# Patient Record
Sex: Female | Born: 1980 | Race: White | Hispanic: No | Marital: Single | Smoking: Former smoker
Health system: Southern US, Community
[De-identification: ages and names within clinical notes are randomized; demographics above are authoritative.]

## PROBLEM LIST (undated history)

## (undated) DIAGNOSIS — R1319 Other dysphagia: Secondary | ICD-10-CM

## (undated) DIAGNOSIS — R1310 Dysphagia, unspecified: Secondary | ICD-10-CM

## (undated) DIAGNOSIS — F32A Depression, unspecified: Secondary | ICD-10-CM

## (undated) DIAGNOSIS — L409 Psoriasis, unspecified: Secondary | ICD-10-CM

## (undated) DIAGNOSIS — F329 Major depressive disorder, single episode, unspecified: Secondary | ICD-10-CM

## (undated) DIAGNOSIS — F419 Anxiety disorder, unspecified: Secondary | ICD-10-CM

## (undated) HISTORY — PX: NO PAST SURGERIES: SHX2092

---
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
## 2006-07-30 ENCOUNTER — Emergency Department (HOSPITAL_COMMUNITY): Admission: EM | Admit: 2006-07-30 | Discharge: 2006-07-30 | Payer: Self-pay | Admitting: Emergency Medicine

## 2008-09-09 ENCOUNTER — Emergency Department (HOSPITAL_COMMUNITY): Admission: EM | Admit: 2008-09-09 | Discharge: 2008-09-09 | Payer: Self-pay | Admitting: Emergency Medicine

## 2010-02-02 ENCOUNTER — Emergency Department (HOSPITAL_COMMUNITY): Admission: EM | Admit: 2010-02-02 | Discharge: 2010-02-02 | Payer: Self-pay | Admitting: Emergency Medicine

## 2010-02-12 ENCOUNTER — Emergency Department (HOSPITAL_COMMUNITY): Admission: EM | Admit: 2010-02-12 | Discharge: 2010-02-12 | Payer: Self-pay | Admitting: Emergency Medicine

## 2010-02-13 ENCOUNTER — Emergency Department (HOSPITAL_COMMUNITY): Admission: EM | Admit: 2010-02-13 | Discharge: 2010-02-13 | Payer: Self-pay | Admitting: Emergency Medicine

## 2010-10-13 ENCOUNTER — Emergency Department (HOSPITAL_COMMUNITY)
Admission: EM | Admit: 2010-10-13 | Discharge: 2010-10-13 | Payer: Self-pay | Source: Home / Self Care | Admitting: Emergency Medicine

## 2010-12-14 ENCOUNTER — Emergency Department (HOSPITAL_COMMUNITY)
Admission: EM | Admit: 2010-12-14 | Discharge: 2010-12-14 | Disposition: A | Discharge status: Home / Self Care | Payer: Self-pay | Attending: Emergency Medicine | Admitting: Emergency Medicine

## 2010-12-14 DIAGNOSIS — K921 Melena: Secondary | ICD-10-CM | POA: Insufficient documentation

## 2010-12-14 DIAGNOSIS — R197 Diarrhea, unspecified: Secondary | ICD-10-CM | POA: Insufficient documentation

## 2010-12-14 DIAGNOSIS — F3289 Other specified depressive episodes: Secondary | ICD-10-CM | POA: Insufficient documentation

## 2010-12-14 DIAGNOSIS — R112 Nausea with vomiting, unspecified: Secondary | ICD-10-CM | POA: Insufficient documentation

## 2010-12-14 DIAGNOSIS — K5289 Other specified noninfective gastroenteritis and colitis: Secondary | ICD-10-CM | POA: Insufficient documentation

## 2010-12-14 DIAGNOSIS — R109 Unspecified abdominal pain: Secondary | ICD-10-CM | POA: Insufficient documentation

## 2010-12-14 DIAGNOSIS — F329 Major depressive disorder, single episode, unspecified: Secondary | ICD-10-CM | POA: Insufficient documentation

## 2010-12-14 LAB — URINALYSIS, ROUTINE W REFLEX MICROSCOPIC
Hgb urine dipstick: NEGATIVE
Ketones, ur: NEGATIVE mg/dL
Nitrite: NEGATIVE
Protein, ur: NEGATIVE mg/dL
Specific Gravity, Urine: 1.03 (ref 1.005–1.030)
pH: 5.5 (ref 5.0–8.0)

## 2010-12-14 LAB — CBC
MCHC: 35.4 g/dL (ref 30.0–36.0)
MCV: 85.2 fL (ref 78.0–100.0)
Platelets: 391 10*3/uL (ref 150–400)
RBC: 4.65 MIL/uL (ref 3.87–5.11)

## 2010-12-14 LAB — DIFFERENTIAL
Basophils Absolute: 0.1 10*3/uL (ref 0.0–0.1)
Basophils Relative: 0 % (ref 0–1)
Eosinophils Absolute: 0.1 10*3/uL (ref 0.0–0.7)
Eosinophils Relative: 1 % (ref 0–5)
Monocytes Absolute: 0.6 10*3/uL (ref 0.1–1.0)
Neutro Abs: 9.4 10*3/uL — ABNORMAL HIGH (ref 1.7–7.7)

## 2010-12-14 LAB — COMPREHENSIVE METABOLIC PANEL
ALT: 14 U/L (ref 0–35)
Albumin: 4.4 g/dL (ref 3.5–5.2)
Alkaline Phosphatase: 39 U/L (ref 39–117)
Creatinine, Ser: 0.7 mg/dL (ref 0.4–1.2)
GFR calc non Af Amer: >60 mL/min (ref >60)
Glucose, Bld: 97 mg/dL (ref 70–99)
Total Bilirubin: 1.4 mg/dL — ABNORMAL HIGH (ref 0.3–1.2)

## 2010-12-14 LAB — LIPASE, BLOOD: Lipase: 24 U/L (ref 11–59)

## 2010-12-14 LAB — POCT PREGNANCY, URINE: Preg Test, Ur: NEGATIVE

## 2010-12-14 LAB — URINE MICROSCOPIC-ADD ON

## 2010-12-15 LAB — URINE CULTURE: Colony Count: >=100000

## 2011-06-25 LAB — DIFFERENTIAL
Basophils Relative: 1 % (ref 0–1)
Lymphocytes Relative: 14 % (ref 12–46)
Monocytes Relative: 4 % (ref 3–12)

## 2011-06-25 LAB — COMPREHENSIVE METABOLIC PANEL
AST: 13 U/L (ref 0–37)
Albumin: 4 g/dL (ref 3.5–5.2)
Alkaline Phosphatase: 35 U/L — ABNORMAL LOW (ref 39–117)
BUN: 7 mg/dL (ref 6–23)
CO2: 27 mEq/L (ref 19–32)
Sodium: 139 mEq/L (ref 135–145)

## 2011-06-25 LAB — POCT I-STAT, CHEM 8
BUN: 9 mg/dL (ref 6–23)
Calcium, Ion: 1.19 mmol/L (ref 1.12–1.32)
Chloride: 106 mEq/L (ref 96–112)
Creatinine, Ser: 0.7 mg/dL (ref 0.4–1.2)
HCT: 45 % (ref 36.0–46.0)
Hemoglobin: 15.3 g/dL — ABNORMAL HIGH (ref 12.0–15.0)
Sodium: 143 mEq/L (ref 135–145)
TCO2: 26 mmol/L (ref 0–100)

## 2011-06-25 LAB — CBC
Hemoglobin: 14.3 g/dL (ref 12.0–15.0)
MCV: 91.6 fL (ref 78.0–100.0)
Platelets: 372 10*3/uL (ref 150–400)
RBC: 4.69 MIL/uL (ref 3.87–5.11)
RDW: 12.9 % (ref 11.5–15.5)
WBC: 11.1 10*3/uL — ABNORMAL HIGH (ref 4.0–10.5)

## 2011-06-25 LAB — POCT URINALYSIS DIP (DEVICE)
Ketones, ur: NEGATIVE mg/dL
Nitrite: NEGATIVE

## 2011-06-25 LAB — OCCULT BLOOD X 1 CARD TO LAB, STOOL: Fecal Occult Bld: POSITIVE

## 2011-06-25 LAB — LIPASE, BLOOD: Lipase: 21 U/L (ref 11–59)

## 2017-12-30 DIAGNOSIS — F1211 Cannabis abuse, in remission: Secondary | ICD-10-CM | POA: Insufficient documentation

## 2018-09-20 NOTE — L&D Delivery Note (Signed)
Kelly Lopez is a 38 y.o. G3P0020 at [redacted]w[redacted]d admitted for IOL forTurner syndrome with associatedcystic hygroma, fetal hydrops, multiple anomalies. Underwent induction of labor with high dose misoprostol, intracervical foley bulb placement and artifical rupture of membranes. Received IV analgesia then epidural. Called for delivery after foley bulb was noted to come out and fetal feet were noted at introitus around 0600 08/31/2019.   Delivery Note At 0605 a non-viable female was delivered in footling breech presentation.  Very large cystic hygroma immediately noted as well as diffuse ascites. After delivery of fetus, there was some red-brown flid and some bleeding, and a portion of the placenta delivered.  The rest of the placenta attached to the umbilical cord was noted to still be adherent, did not come after a few pushing efforts.  EBL about 200 ml at this point.    Pitocin 10 mg IM x 1 given, will be due for next misoprostol dose in less than one hour. This will be given if placenta is still undelivered.  Will keep patient NPO, she was advised about risk of needing D&E for retained placental fragments, bleeding or any other concerning situation.  Will continue to monitor closely. Appropriate support given to her and her husband; they are appropriately grieving and are holding the infant.     Verita Schneiders, MD, [place] for [employer], [place]

## 2019-03-21 ENCOUNTER — Other Ambulatory Visit: Payer: Self-pay

## 2019-03-21 ENCOUNTER — Encounter: Payer: Self-pay | Admitting: Emergency Medicine

## 2019-03-21 DIAGNOSIS — R509 Fever, unspecified: Secondary | ICD-10-CM | POA: Diagnosis present

## 2019-03-21 DIAGNOSIS — U071 COVID-19: Secondary | ICD-10-CM | POA: Insufficient documentation

## 2019-03-21 DIAGNOSIS — J069 Acute upper respiratory infection, unspecified: Secondary | ICD-10-CM | POA: Insufficient documentation

## 2019-03-21 DIAGNOSIS — R110 Nausea: Secondary | ICD-10-CM | POA: Diagnosis not present

## 2019-03-21 LAB — COMPREHENSIVE METABOLIC PANEL
ALT: 52 U/L — ABNORMAL HIGH (ref 0–44)
AST: 54 U/L — ABNORMAL HIGH (ref 15–41)
Albumin: 4.2 g/dL (ref 3.5–5.0)
Alkaline Phosphatase: 43 U/L (ref 38–126)
Anion gap: 12 (ref 5–15)
BUN: 8 mg/dL (ref 6–20)
CO2: 23 mmol/L (ref 22–32)
Calcium: 8.6 mg/dL — ABNORMAL LOW (ref 8.9–10.3)
Chloride: 105 mmol/L (ref 98–111)
Creatinine, Ser: 0.61 mg/dL (ref 0.44–1.00)
GFR calc Af Amer: >60 mL/min (ref >60)
GFR calc non Af Amer: >60 mL/min (ref >60)
Glucose, Bld: 108 mg/dL — ABNORMAL HIGH (ref 70–99)
Potassium: 3.4 mmol/L — ABNORMAL LOW (ref 3.5–5.1)
Sodium: 140 mmol/L (ref 135–145)
Total Bilirubin: 0.7 mg/dL (ref 0.3–1.2)
Total Protein: 7.5 g/dL (ref 6.5–8.1)

## 2019-03-21 LAB — CBC WITH DIFFERENTIAL/PLATELET
Abs Immature Granulocytes: 0.01 10*3/uL (ref 0.00–0.07)
Basophils Absolute: 0 10*3/uL (ref 0.0–0.1)
Basophils Relative: 0 %
Eosinophils Absolute: 0 10*3/uL (ref 0.0–0.5)
Eosinophils Relative: 0 %
HCT: 41.8 % (ref 36.0–46.0)
Hemoglobin: 14.4 g/dL (ref 12.0–15.0)
Immature Granulocytes: 0 %
Lymphocytes Relative: 21 %
Lymphs Abs: 0.9 10*3/uL (ref 0.7–4.0)
MCH: 30.2 pg (ref 26.0–34.0)
MCHC: 34.4 g/dL (ref 30.0–36.0)
MCV: 87.6 fL (ref 80.0–100.0)
Monocytes Absolute: 0.4 10*3/uL (ref 0.1–1.0)
Monocytes Relative: 9 %
Neutro Abs: 3 10*3/uL (ref 1.7–7.7)
Neutrophils Relative %: 70 %
Platelets: 198 10*3/uL (ref 150–400)
RBC: 4.77 MIL/uL (ref 3.87–5.11)
RDW: 12.2 % (ref 11.5–15.5)
WBC: 4.4 10*3/uL (ref 4.0–10.5)
nRBC: 0 % (ref 0.0–0.2)

## 2019-03-21 LAB — URINALYSIS, COMPLETE (UACMP) WITH MICROSCOPIC
Bilirubin Urine: NEGATIVE
Glucose, UA: NEGATIVE mg/dL
Hgb urine dipstick: NEGATIVE
Ketones, ur: 20 mg/dL — AB
Leukocytes,Ua: NEGATIVE
Nitrite: NEGATIVE
Protein, ur: 30 mg/dL — AB
Specific Gravity, Urine: 1.028 (ref 1.005–1.030)
pH: 5 (ref 5.0–8.0)

## 2019-03-21 LAB — LACTIC ACID, PLASMA: Lactic Acid, Venous: 0.9 mmol/L (ref 0.5–1.9)

## 2019-03-21 LAB — POCT PREGNANCY, URINE: Preg Test, Ur: NEGATIVE

## 2019-03-21 MED ORDER — SODIUM CHLORIDE 0.9% FLUSH: 3.0000 mL | Freq: Once | INTRAVENOUS | Status: DC

## 2019-03-21 MED ORDER — ACETAMINOPHEN 325 MG PO TABS
650.0000 mg | ORAL_TABLET | Freq: Once | ORAL | Status: AC | PRN
  Administered 2019-03-21: 650 mg via ORAL
  Filled 2019-03-21: qty 2

## 2019-03-21 NOTE — ED Triage Notes (Signed)
Patient coming in with cough, fever, and weakness. Patient was exposed to some with COVID-19 on 6/23 and was tested on 6/25 and tested negative at [facility]. But 4-5 days ago started having cough, fever and felt very sleepy.

## 2019-03-22 ENCOUNTER — Emergency Department
Admission: EM | Admit: 2019-03-22 | Discharge: 2019-03-22 | Disposition: A | Discharge status: Home / Self Care | Payer: No Typology Code available for payment source | Attending: Emergency Medicine | Admitting: Emergency Medicine

## 2019-03-22 ENCOUNTER — Encounter: Payer: Self-pay | Admitting: Emergency Medicine

## 2019-03-22 ENCOUNTER — Emergency Department: Payer: No Typology Code available for payment source

## 2019-03-22 DIAGNOSIS — R110 Nausea: Secondary | ICD-10-CM

## 2019-03-22 DIAGNOSIS — R509 Fever, unspecified: Secondary | ICD-10-CM

## 2019-03-22 DIAGNOSIS — Z20822 Contact with and (suspected) exposure to covid-19: Secondary | ICD-10-CM

## 2019-03-22 DIAGNOSIS — J069 Acute upper respiratory infection, unspecified: Secondary | ICD-10-CM

## 2019-03-22 DIAGNOSIS — Z20828 Contact with and (suspected) exposure to other viral communicable diseases: Secondary | ICD-10-CM

## 2019-03-22 HISTORY — DX: Depression, unspecified: F32.A

## 2019-03-22 HISTORY — DX: Psoriasis, unspecified: L40.9

## 2019-03-22 HISTORY — DX: Other dysphagia: R13.19

## 2019-03-22 HISTORY — DX: Anxiety disorder, unspecified: F41.9

## 2019-03-22 MED ORDER — ONDANSETRON 4 MG PO TBDP
4.0000 mg | ORAL_TABLET | Freq: Once | ORAL | Status: AC
  Administered 2019-03-22: 4 mg via ORAL
  Filled 2019-03-22: qty 1

## 2019-03-22 MED ORDER — ONDANSETRON 4 MG PO TBDP: ORAL_TABLET | ORAL | 0 refills | Status: DC

## 2019-03-22 MED ORDER — FOSFOMYCIN TROMETHAMINE 3 G PO PACK
3.0000 g | PACK | Freq: Once | ORAL | Status: AC
  Administered 2019-03-22: 3 g via ORAL
  Filled 2019-03-22: qty 3

## 2019-03-22 NOTE — ED Notes (Signed)
EDP in with patient 

## 2019-03-22 NOTE — ED Provider Notes (Signed)
[facility] Emergency Department Provider Note  ____________________________________________   First MD Initiated Contact with Patient 03/22/19 0105     (approximate)  I have reviewed the triage vital signs and the nursing notes.   HISTORY  Chief Complaint Fever and Cough    HPI Kelly Lopez is a 38 y.o. 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  She presents for evaluation of right a viral symptoms that include general malaise, nausea, cough, fever as high as 101.8, sore throat.  She reports that about 9 days ago she was caring for patient and her hospital and spent most of the day with him and then found out after the fact that he was COVID positive.  She was tested about a week ago with employee health at her hospital and the results were negative, but over the last 3 days she has developed all the symptoms described above and she also reports that when they swabbed her at employee health they barely got inside the naris rather than doing a deep sinus swab.  She reports that her symptoms are severe nothing in particular makes it better or worse.  She has not had chest pain or vomiting.  She has had no dysuria but increased urinary frequency and wonders if she may also have a urinary tract infection, but the symptoms are mild.  She is not really having any trouble breathing although she feels bad all over and has a mild cough.        Past Medical History:  Diagnosis Date   Anxiety    Depression    Esophageal dysphagia    Psoriasis     There are no active problems to display for this patient.   History reviewed. No pertinent surgical history.  Prior to Admission medications   Medication Sig Start Date End Date Taking? Authorizing Provider  ondansetron (ZOFRAN ODT) 4 MG disintegrating tablet Allow 1-2 tablets to dissolve in your mouth every 8 hours as needed for nausea/vomiting 03/22/19    Loleta RoseForbach, Laela Deviney, MD    Allergies Patient has no known allergies.  History reviewed. No pertinent family history.  Social History Social History   Tobacco Use   Smoking status: Never Smoker   Smokeless tobacco: Never Used  Substance Use Topics   Alcohol use: Not on file   Drug use: Not on file    Review of Systems Constitutional: Fever and chills with general malaise and generalized weakness and fatigue. Eyes: No visual changes. ENT: +sore throat. Cardiovascular: Denies chest pain. Respiratory: Denies shortness of breath but has a cough. Gastrointestinal: No abdominal pain.  Nausea, no vomiting.  No diarrhea.  No constipation. Genitourinary: No dysuria but some increased urinary frequency concerning for UTI. Musculoskeletal: Negative for neck pain.  Negative for back pain. Integumentary: Negative for rash other than chronic psoriasis Neurological: Negative for headaches, focal weakness or numbness.   ____________________________________________   PHYSICAL EXAM:  VITAL SIGNS: ED Triage Vitals  Enc Vitals Group     BP 03/21/19 2011 108/79     Pulse Rate 03/21/19 2011 (!) 124     Resp 03/21/19 2011 20     Temp 03/21/19 2011 (!) 101.1 F (38.4 C)     Temp Source 03/21/19 2011 Oral     SpO2 03/21/19 2011 95 %     Weight 03/21/19 2012 86.2 kg (190 lb)     Height 03/21/19 2012 1.6 m (5\' 3" )  Head Circumference --      Peak Flow --      Pain Score 03/21/19 2011 6     Pain Loc --      Pain Edu? --      Excl. in GC? --     Constitutional: Alert and oriented.  Ill-appearing but nontoxic.  No acute distress. Eyes: Conjunctivae are normal.  Head: Atraumatic. Nose: No congestion/rhinnorhea. Neck: No stridor.  No meningeal signs.   Cardiovascular: Initially tachycardic at triage but in the exam room she has a normal rate, regular rhythm. Good peripheral circulation. Grossly normal heart sounds. Respiratory: Normal respiratory effort.  No retractions. No audible  wheezing. Gastrointestinal: Soft and nontender. No distention.  Musculoskeletal: No lower extremity tenderness nor edema. No gross deformities of extremities. Neurologic:  Normal speech and language. No gross focal neurologic deficits are appreciated.  Skin:  Skin is warm, dry and intact. No rash noted other than chronic psoriasis most notable at the hairline. Psychiatric: Mood and affect are normal. Speech and behavior are normal.  ____________________________________________   LABS (all labs ordered are listed, but only abnormal results are displayed)  Labs Reviewed  COMPREHENSIVE METABOLIC PANEL - Abnormal; Notable for the following components:      Result Value   Potassium 3.4 (*)    Glucose, Bld 108 (*)    Calcium 8.6 (*)    AST 54 (*)    ALT 52 (*)    All other components within normal limits  URINALYSIS, COMPLETE (UACMP) WITH MICROSCOPIC - Abnormal; Notable for the following components:   Color, Urine AMBER (*)    APPearance CLOUDY (*)    Ketones, ur 20 (*)    Protein, ur 30 (*)    Bacteria, UA RARE (*)    All other components within normal limits  URINE CULTURE  NOVEL CORONAVIRUS, NAA (HOSPITAL ORDER, SEND-OUT TO REF LAB)  LACTIC ACID, PLASMA  CBC WITH DIFFERENTIAL/PLATELET  LACTIC ACID, PLASMA  POC URINE PREG, ED  POCT PREGNANCY, URINE   ____________________________________________  EKG  No indication for EKG ____________________________________________  RADIOLOGY I, Loleta Roseory Margarethe Virgen, personally viewed and evaluated these images (plain radiographs) as part of my medical decision making, as well as reviewing the written report by the radiologist.  ED MD interpretation: No acute abnormalities on chest x-ray  Official radiology report(s): Dg Chest Portable 1 View  Result Date: 03/22/2019 CLINICAL DATA:  Cough and fever EXAM: PORTABLE CHEST 1 VIEW COMPARISON:  None. FINDINGS: The heart size and mediastinal contours are within normal limits. Both lungs are clear. The  visualized skeletal structures are unremarkable. IMPRESSION: No active disease. Electronically Signed   By: Jasmine PangKim  Fujinaga M.D.   On: 03/22/2019 01:17    ____________________________________________   PROCEDURES   Procedure(s) performed (including Critical Care):  Procedures   ____________________________________________   INITIAL IMPRESSION / MDM / ASSESSMENT AND PLAN / ED COURSE  As part of my medical decision making, I reviewed the following data within the electronic MEDICAL RECORD NUMBER Nursing notes reviewed and incorporated, Labs reviewed , Old chart reviewed, Radiograph reviewed  and Notes from prior ED visits        Differential diagnosis includes, but is not limited to, COVID-19, other nonspecific viral respiratory infection, pneumonia, urinary tract infection.  The patient does not feel well and is clearly suffering from some sort of viral infection but does not meet admission criteria.  Her lab work is generally reassuring, no leukocytosis, no lymphopenia, normal lactic acid, initially febrile and tachycardic but  that tachycardia resolved.  She may have a mild urinary tract infection and I am treating with a one-time dose of fosfomycin as well as sending a urine culture.  After I discussed plans with her she does not want to stay in the hospital and I explained that she does not meet criteria regardless, and she needs to treat conservatively at home by treating the symptoms, drinking plenty of fluids, and isolating herself and certainly not returning to work at the hospital.  I am sending the send out COVID-19 test and anticipated will likely be positive.  She has access to MyChart and can get the results online.  I gave the standard COVID-19 information and including strict return precautions and discharge instructions and she understands and agrees with the plan.      ____________________________________________  FINAL CLINICAL IMPRESSION(S) / ED DIAGNOSES  Final diagnoses:   Fever, unspecified fever cause  Viral URI with cough  Close Exposure to Covid-19 Virus  Nausea     MEDICATIONS GIVEN DURING THIS VISIT:  Medications  sodium chloride flush (NS) 0.9 % injection 3 mL (has no administration in time range)  fosfomycin (MONUROL) packet 3 g (has no administration in time range)  ondansetron (ZOFRAN-ODT) disintegrating tablet 4 mg (has no administration in time range)  acetaminophen (TYLENOL) tablet 650 mg (650 mg Oral Given 03/21/19 2022)     ED Discharge Orders         Ordered    ondansetron (ZOFRAN ODT) 4 MG disintegrating tablet     03/22/19 0131          *Please note:  Kelly Lopez was evaluated in Emergency Department on 03/22/2019 for the symptoms described in the history of present illness. She was evaluated in the context of the global COVID-19 pandemic, which necessitated consideration that the patient might be at risk for infection with the SARS-CoV-2 virus that causes COVID-19. Institutional protocols and algorithms that pertain to the evaluation of patients at risk for COVID-19 are in a state of rapid change based on information released by regulatory bodies including the CDC and federal and state organizations. These policies and algorithms were followed during the patient's care in the ED.  Some ED evaluations and interventions may be delayed as a result of limited staffing during the pandemic.*  Note:  This document was prepared using Dragon voice recognition software and may include unintentional dictation errors.   Hinda Kehr, MD 03/22/19 [phone]

## 2019-03-22 NOTE — Discharge Instructions (Signed)
As we discussed, we believe your symptoms are caused by a respiratory virus.  However, because we cannot rule out the possibility of COVID-19 at this time, we recommend that you self-quarantine at home for 14 days, or until 3 consecutive days without fever (without taking medication to make your temperature come down, such as Tylenol (acetaminophen), after your respiratory symptoms have improved, and after at least 7 days have passed since your symptoms first appeared.  Additionally, we sent a nasal swab to the lab, so you should get the results in MyChart within a few business days.  Even if this result is negative, though, we recommend you do not return to work until your symptoms have resolved.  Please coordinate with employee health.  You should have as minimal contact as possible with anyone else including close family as per the [facility] paperwork guidelines listed below. Follow-up with your doctor by phone or online as needed and return immediately to the emergency department or call 911 only if you develop new or worsening symptoms that concern you.  You can find up-to-date information about COVID-19 in [place] by calling the [place]: [phone]. You may also call 2-1-1, or [phone], or additional resources.  You can also find information online at [email or web address]-white.com/, or on the Center for Disease Control (CDC) website at BeginnerSteps.be.

## 2019-03-22 NOTE — ED Notes (Signed)
Patient states she has been running a fever for the past 4-5 days and has been coughing and having SOB. 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

## 2019-03-23 LAB — NOVEL CORONAVIRUS, NAA (HOSP ORDER, SEND-OUT TO REF LAB; TAT 18-24 HRS): SARS-CoV-2, NAA: DETECTED — AB

## 2019-03-23 LAB — URINE CULTURE: Special Requests: NORMAL

## 2019-06-19 DIAGNOSIS — O283 Abnormal ultrasonic finding on antenatal screening of mother: Secondary | ICD-10-CM | POA: Insufficient documentation

## 2019-06-19 DIAGNOSIS — O418X10 Other specified disorders of amniotic fluid and membranes, first trimester, not applicable or unspecified: Secondary | ICD-10-CM | POA: Insufficient documentation

## 2019-06-19 DIAGNOSIS — O09291 Supervision of pregnancy with other poor reproductive or obstetric history, first trimester: Secondary | ICD-10-CM | POA: Insufficient documentation

## 2019-06-19 DIAGNOSIS — O0990 Supervision of high risk pregnancy, unspecified, unspecified trimester: Secondary | ICD-10-CM | POA: Insufficient documentation

## 2019-06-19 DIAGNOSIS — O09522 Supervision of elderly multigravida, second trimester: Secondary | ICD-10-CM | POA: Insufficient documentation

## 2019-06-19 DIAGNOSIS — O09529 Supervision of elderly multigravida, unspecified trimester: Secondary | ICD-10-CM | POA: Insufficient documentation

## 2019-06-19 DIAGNOSIS — O468X1 Other antepartum hemorrhage, first trimester: Secondary | ICD-10-CM | POA: Insufficient documentation

## 2019-07-11 ENCOUNTER — Encounter: Payer: Self-pay | Admitting: Radiology

## 2019-07-11 ENCOUNTER — Encounter: Payer: Self-pay | Admitting: Family Medicine

## 2019-07-11 ENCOUNTER — Other Ambulatory Visit: Payer: Self-pay

## 2019-07-11 ENCOUNTER — Ambulatory Visit (INDEPENDENT_AMBULATORY_CARE_PROVIDER_SITE_OTHER): Payer: No Typology Code available for payment source | Admitting: Family Medicine

## 2019-07-11 VITALS — BP 113/78 | HR 94 | Wt 192.0 lb

## 2019-07-11 DIAGNOSIS — F329 Major depressive disorder, single episode, unspecified: Secondary | ICD-10-CM

## 2019-07-11 DIAGNOSIS — F32A Depression, unspecified: Secondary | ICD-10-CM | POA: Insufficient documentation

## 2019-07-11 DIAGNOSIS — O0991 Supervision of high risk pregnancy, unspecified, first trimester: Secondary | ICD-10-CM

## 2019-07-11 DIAGNOSIS — O99211 Obesity complicating pregnancy, first trimester: Secondary | ICD-10-CM | POA: Diagnosis not present

## 2019-07-11 DIAGNOSIS — O99210 Obesity complicating pregnancy, unspecified trimester: Secondary | ICD-10-CM

## 2019-07-11 DIAGNOSIS — O99341 Other mental disorders complicating pregnancy, first trimester: Secondary | ICD-10-CM

## 2019-07-11 DIAGNOSIS — Z3A13 13 weeks gestation of pregnancy: Secondary | ICD-10-CM

## 2019-07-11 DIAGNOSIS — F419 Anxiety disorder, unspecified: Secondary | ICD-10-CM

## 2019-07-11 DIAGNOSIS — E669 Obesity, unspecified: Secondary | ICD-10-CM | POA: Insufficient documentation

## 2019-07-11 DIAGNOSIS — O358XX0 Maternal care for other (suspected) fetal abnormality and damage, not applicable or unspecified: Secondary | ICD-10-CM | POA: Insufficient documentation

## 2019-07-11 DIAGNOSIS — Z3401 Encounter for supervision of normal first pregnancy, first trimester: Secondary | ICD-10-CM | POA: Insufficient documentation

## 2019-07-11 DIAGNOSIS — F1211 Cannabis abuse, in remission: Secondary | ICD-10-CM

## 2019-07-11 DIAGNOSIS — O0990 Supervision of high risk pregnancy, unspecified, unspecified trimester: Secondary | ICD-10-CM

## 2019-07-11 NOTE — Progress Notes (Signed)
Subjective:   Kelly Lopez is a 38 y.o. G3P0020 at [redacted]w[redacted]d by LMP, early ultrasound being seen today for her first obstetrical visit.  Her obstetrical history is significant for advanced maternal age and obesity and has Antepartum multigravida of advanced maternal age; History of miscarriage, currently pregnant, first trimester; Subchorionic hemorrhage of placenta in first trimester; Supervision of high risk pregnancy, antepartum; History of substance abuse ([place]); Cystic hygroma of fetus in singleton pregnancy; Obesity affecting pregnancy, antepartum; and Anxiety and depression on their problem list.. Pregnancy history fully reviewed.  Carigan reports fatigue, nausea, vomiting and constipation (n/v are mild).   Fatigue is close to her baseline but she reports that on her days off from work she is only awake/out of bed for 6 hours out of the day. She states that she has been this way for as long as she can remember and she does not report an increase since becoming pregnant. She has a history of anxiety and depression, currently on Effexor. She has previously seen a counselor for self-medicating her depression with marijuana that affected her work. Her counseling ended this year and her marijuana use is remote (~2 years ago). She states that she is anxious about this pregnancy because she experienced early pregnancy loss in early 2020 and this pregnancy is complicated by recent abnormal u/s showing cystic hygroma. She desires to continue this pregnancy. She is interested in being connect to behavioral health services during pregnancy.   HISTORY: OB History  Gravida Para Term Preterm AB Living  3 0 0 0 2 0  SAB TAB Ectopic Multiple Live Births  1 1 0 0 0    # Outcome Date GA Lbr Len/2nd Weight Sex Delivery Anes PTL Lv  3 Current           2 SAB 2020          1 TAB 2000           Past Medical History:  Diagnosis Date  . Anxiety   . Depression   . Esophageal dysphagia   . Psoriasis     Past Surgical History:  Procedure Laterality Date  . NO PAST SURGERIES     History reviewed. No pertinent family history. Social History   Tobacco Use  . Smoking status: Never Smoker  . Smokeless tobacco: Never Used  Substance Use Topics  . Alcohol use: Not Currently  . Drug use: Not Currently   Allergies  Allergen Reactions  . Zoloft [Sertraline Hcl]    Current Outpatient Medications on File Prior to Visit  Medication Sig Dispense Refill  . docusate sodium (COLACE) 100 MG capsule Take 100 mg by mouth 2 (two) times daily.    Marland Kitchen omeprazole (PRILOSEC) 20 MG capsule Take 20 mg by mouth daily.    . Prenatal Vit-Fe Fumarate-FA (MULTIVITAMIN-PRENATAL) 27-0.8 MG TABS tablet Take 1 tablet by mouth daily at 12 noon.    . venlafaxine XR (EFFEXOR-XR) 150 MG 24 hr capsule Take 150 mg by mouth daily with breakfast.    . ondansetron (ZOFRAN ODT) 4 MG disintegrating tablet Allow 1-2 tablets to dissolve in your mouth every 8 hours as needed for nausea/vomiting 30 tablet 0   No current facility-administered medications on file prior to visit.     Possible Indications for ASA therapy (per uptodate) One of the following: Previous pregnancy with preeclampsia, especially early onset and with an adverse outcome No Multifetal gestation No Chronic hypertension No Type 1 or 2 diabetes mellitus No  Chronic kidney disease No Autoimmune disease (antiphospholipid syndrome, systemic lupus erythematosus) No (Hx psoriasis)  Two or more of the following: Nulliparity No Obesity (body mass index >30 kg/m2) Yes Family history of preeclampsia in mother or sister No Age ?35 years Yes Sociodemographic characteristics (African American race, low socioeconomic level) No Personal risk factors (eg, previous pregnancy with low birth weight or small for gestational age infant, previous adverse pregnancy outcome [eg, stillbirth], interval >10 years between pregnancies) No  No Indications for early 1 hour GTT (per  uptodate)  BMI >25 (>23 in Asian women) AND one of the following  Gestational diabetes mellitus in a previous pregnancy No Glycated hemoglobin ?5.7 percent (39 mmol/mol), impaired glucose tolerance, or impaired fasting glucose on previous testing No First-degree relative with diabetes No High-risk race/ethnicity (eg, African American, Latino, Native American, [place] American, Pacific Islander) No History of cardiovascular disease No Hypertension or on therapy for hypertension No High-density lipoprotein cholesterol level <35 mg/dL (1.610.90 mmol/L) and/or a triglyceride level >250 mg/dL (0.962.82 mmol/L) No Polycystic ovary syndrome No Physical inactivity Yes Other clinical condition associated with insulin resistance (eg, severe obesity, acanthosis nigricans) No Previous birth of an infant weighing ?4000 g No Previous stillbirth of unknown cause No Exam   Vitals:   07/11/19 1518  BP: 113/78  Pulse: 94  Weight: 192 lb (87.1 kg)   Fetal Heart Rate (bpm): 155  VS reviewed, nursing note reviewed,  Constitutional: well developed, well nourished, no distress HEENT: normocephalic CV: normal rate Pulm/chest wall: normal effort Abdomen: soft Neuro: alert and oriented x 3 Skin: warm, dry Psych: affect normal, no signs of hyper or hypoactivity    Assessment:   Pregnancy: [id number] Patient Active Problem List   Diagnosis Date Noted  . 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 Obesity affecting pregnancy, antepartum 07/11/2019  . Anxiety and depression 07/11/2019  . Antepartum multigravida of advanced maternal age 25/29/2020  . History of miscarriage, currently pregnant, first trimester 06/19/2019  . Subchorionic hemorrhage of placenta in first trimester 06/19/2019  . Supervision of high risk pregnancy, antepartum 06/19/2019  . History of substance abuse ([facility]) 12/30/2017  *Of note, substance use is remote (~2 years ago) and was marijuana, no other substances. Has been to  a counselor and report appropriate interventions for avoiding future use.    Plan:  1. Supervision of high risk pregnancy, antepartum Lelon MastSamantha is progressing appropriately with reassuring FHTs and is being connected to our practice due to her high risk pregnancy features, including her recent u/s showing fetal cystic hygroma. She is anxious about next steps but glad to get connected to our practice.  - Prenatal Vit-Fe Fumarate-FA (MULTIVITAMIN-PRENATAL) 27-0.8 MG TABS tablet; Take 1 tablet by mouth daily at 12 noon. - omeprazole (PRILOSEC) 20 MG capsule; Take 20 mg by mouth daily. - venlafaxine XR (EFFEXOR-XR) 150 MG 24 hr capsule; Take 150 mg by mouth daily with breakfast. - docusate sodium (COLACE) 100 MG capsule; Take 100 mg by mouth 2 (two) times daily. - Enroll Patient in Babyscripts - Babyscripts Schedule Optimization - AMB referral to maternal fetal medicine - US MFM OB DETAIL +14 WK; Future  2. Cystic hygroma of fetus in singleton pregnancy Lelon MastSamantha is concerned about these features but desires to continue her pregnancy to term and prefers direct communication regarding the risks/outcomes of her fetus.  - AMB referral to maternal fetal medicine - US MFM OB DETAIL +14 WK; Future  3. Obesity affecting pregnancy, antepartum Monitor for s/s of gestation  diabetes and HTN, no concerns at this time. Encourage healthy eating and low-intensity exercise at future visits.   4. Anxiety and depression Tabatha reports that she feels her anx/dep is controlled, but her hypersomnolence is concerning for poorly controlled depression. We will connect her with our behavioral health teams and closely follow her for ante and post partum worsening of anxiety/depression. - Ambulatory referral to Integrated Behavioral Health   Initial labs drawn. Continue prenatal vitamins. Genetic screening done with prior [facility] providers, no concerning features. Discussed future amniocentesis.  Ultrasound discussed;  fetal anatomic survey: requested. Problem list reviewed and updated. The nature of [facility] with multiple MDs and other Advanced Practice Providers was explained to patient; also emphasized that residents, students are part of our team. Routine obstetric precautions reviewed. Return in about 4 weeks (around 08/08/2019) for Routine prenatal care, in person.   Irving Shows, Medical Student 07/11/19 4:29 PM

## 2019-07-11 NOTE — Progress Notes (Signed)
Transfer of Care PRENATAL VISIT NOTE  Subjective:  Kelly Lopez is a 38 y.o. G3P0020 at [redacted]w[redacted]d being seen today for ongoing prenatal care- established at [facility] but needs MFM care due to cystic hydroma..  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  Patient reports no complaints.  Contractions: Not present. Vag. Bleeding: None.   . Denies leaking of fluid.   The following portions of the patient's history were reviewed and updated as appropriate: allergies, current medications, past family history, past medical history, past social history, past surgical history and problem list.   Objective:   Vitals:   07/11/19 1518  BP: 113/78  Pulse: 94  Weight: 192 lb (87.1 kg)    Fetal Status: Fetal Heart Rate (bpm): 155         General:  Alert, oriented and cooperative. Patient is in no acute distress.  Skin: Skin is warm and dry. No rash noted.   Cardiovascular: Normal heart rate noted  Respiratory: Normal respiratory effort, no problems with respiration noted  Abdomen: Soft, gravid, appropriate for gestational age.  Pain/Pressure: Absent     Pelvic: Cervical exam deferred        Extremities: Normal range of motion.  Edema: None  Mental Status: Normal mood and affect. Normal behavior. Normal judgment and thought content.   Assessment and Plan:  Pregnancy: G3P0020 at [redacted]w[redacted]d 1. Supervision of high risk pregnancy, antepartum UP dated OB box with labs from [facility] Addressed nature of [facility]-with multiple MDs and other Advanced Practice Providers was  explained to patient; also emphasized that residents, students are part of our team Reviewed model of care at [facility] and collaboration with MFM expected. Reviewed a referral to MFM being placed to discuss role in care as well as [place]. Briefly discussed amniocentesis and role in gold standard for chromosomal abnormalities. Of note Geana has a low risk NIP Counseled on constipation- takes colace. Reviewed other way to reduce constipation.  - Enroll Patient in Babyscripts - Babyscripts Schedule Optimization - AMB referral to maternal fetal medicine  2. 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 Appreciate MFM consultation for this patient.  - AMB referral to maternal fetal medicine - MFM [place] ordered for ~18wk  3. Obesity affecting pregnancy, antepartum TWG=0 lb (0 kg). Encouraged health diet and low impact exercise. Recommend 11-15 lab total weight gain in pregnancy.   4. Anxiety and depression Pattye reports that she feels her anx/dep is controlled, but her hypersomnolence is concerning for poorly controlled depression. We will connect her with our behavioral health specialty and closely follow her for ante and post partum worsening of anxiety/depression. Patient expressed concern about postpartum depression and we discussed risk factors and importance of having integrated care now.  Naquita was also open about her previous struggles with depression and self medication with marijuana. She never used other substance and has been sober from marijuana for >2 years. She has experience with counseling through her work for substance use.  - Continue SNRI as patient is stable and has intolerance for Zoloft which  caused SI. - Ambulatory referral to [place]  .   Preterm labor symptoms and general obstetric precautions including but not limited to vaginal bleeding,  contractions, leaking of fluid and fetal movement were reviewed in detail with the patient. Please refer to After Visit Summary for other counseling recommendations.   Return in about 4 weeks (around 08/08/2019) for Routine prenatal care, in person.  Future Appointments  Date Time Provider [place]  08/07/2019  9:30 AM Emily Filbert, MD [facility]  08/10/2019  1:00 PM [place] 3 [facility]-M[facility]US M[facility]-US   I compiled this note and history with the help of MS3 Gerre Pebbles.  Caren Macadam, MD

## 2019-07-26 ENCOUNTER — Other Ambulatory Visit: Payer: Self-pay

## 2019-07-26 ENCOUNTER — Ambulatory Visit (INDEPENDENT_AMBULATORY_CARE_PROVIDER_SITE_OTHER): Payer: PRIVATE HEALTH INSURANCE | Admitting: Clinical

## 2019-07-26 DIAGNOSIS — F4323 Adjustment disorder with mixed anxiety and depressed mood: Secondary | ICD-10-CM

## 2019-07-26 NOTE — BH Specialist Note (Signed)
Integrated Behavioral Health via Telemedicine Video Visit  07/26/2019 KAMIRAH SHUGRUE [id number]  Number of Integrated Behavioral Health visits: 1 Session Start time: 3:25  Session End time: 4:23 Total time: 82  Referring Provider: Federico Flake, MD Type of Visit: Video Patient/Family location: Home [facility] Provider location: [facility] All persons participating in visit: Patient Kelly Lopez and [facility] Nuha Degner  Confirmed patient's address: Yes  Confirmed patient's phone number: Yes  Any changes to demographics: No   Confirmed patient's insurance: Yes  Any changes to patient's insurance: No   Discussed confidentiality: Yes   I connected with Ariann D Champagne  by a video enabled telemedicine application and verified that I am speaking with the correct person using two identifiers.     I discussed the limitations of evaluation and management by telemedicine and the availability of in person appointments.  I discussed that the purpose of this visit is to provide behavioral health care while limiting exposure to the novel coronavirus.   Discussed there is a possibility of technology failure and discussed alternative modes of communication if that failure occurs.  I discussed that engaging in this video visit, they consent to the provision of behavioral healthcare and the services will be billed under their insurance.  Patient and/or legal guardian expressed understanding and consented to video visit: Yes   PRESENTING CONCERNS: Patient and/or family reports the following symptoms/concerns: Pt states her primary concern today is oversleeping (15-18 hours/day) on days off work, fatigue, anxiety, irritability, and fear of the unknown, attributed to work stress and concern about the baby, and moving into a new home in June. Pt copes by sleeping and sometimes writing or walking; open to learning self-coping strategies today.  Duration of problem: Current pregnancy; Severity of  problem: moderate  STRENGTHS (Protective Factors/Coping Skills): Social support, open to treatment  GOALS ADDRESSED: Patient will: 1.  Reduce symptoms of: anxiety, depression and stress  2.  Increase knowledge and/or ability of: healthy habits and self-management skills  3.  Demonstrate ability to: Increase healthy adjustment to current life circumstances  INTERVENTIONS: Interventions utilized:  Copywriter, advertising, Mining engineer and Psychoeducation and/or Health Education Standardized Assessments completed: GAD-7 and PHQ 9  ASSESSMENT: Patient currently experiencing Adjustment disorder with mixed depression and anxiety.   Patient may benefit from psychoeducation and brief therapeutic interventions regarding coping with symptoms of depression and anxiety .  PLAN: 1. Follow up with behavioral health clinician on : Two weeks 2. Behavioral recommendations:  -Continue taking Effexor, as prescribed -Take a walk on at least one day off work/week (trail on nice days; indoors on rainy days) -CALM relaxation breathing exercise twice daily (prior to work; at bedtime) -Consider writing in journal daily 3. Referral(s): Integrated [employer] (In Clinic)  I discussed the assessment and treatment plan with the patient and/or parent/guardian. They were provided an opportunity to ask questions and all were answered. They agreed with the plan and demonstrated an understanding of the instructions.   They were advised to call back or seek an in-person evaluation if the symptoms worsen or if the condition fails to improve as anticipated.  Maysen Bonsignore C Kalilah Barua  No flowsheet data found.  Depression screen [facility] 2/9 07/26/2019  Decreased Interest 0  Down, Depressed, Hopeless 1  PHQ - 2 Score 1  Altered sleeping 3  Tired, decreased energy 3  Change in appetite 0  Feeling bad or failure about yourself  0  Trouble concentrating 1  Moving slowly or fidgety/restless 0  Suicidal thoughts 0  PHQ-9 Score 8   GAD 7 : Generalized Anxiety Score 07/26/2019  Nervous, Anxious, on Edge 2  Control/stop worrying 1  Worry too much - different things 1  Trouble relaxing 0  Restless 0  Easily annoyed or irritable 2  Afraid - awful might happen 2  Total GAD 7 Score 8

## 2019-07-31 DIAGNOSIS — O219 Vomiting of pregnancy, unspecified: Secondary | ICD-10-CM

## 2019-08-01 MED ORDER — ONDANSETRON 4 MG PO TBDP: 4.0000 mg | ORAL_TABLET | Freq: Three times a day (TID) | ORAL | 1 refills | Status: DC | PRN

## 2019-08-07 ENCOUNTER — Other Ambulatory Visit: Payer: Self-pay

## 2019-08-07 ENCOUNTER — Ambulatory Visit (INDEPENDENT_AMBULATORY_CARE_PROVIDER_SITE_OTHER): Payer: No Typology Code available for payment source | Admitting: Obstetrics & Gynecology

## 2019-08-07 VITALS — BP 115/79 | HR 101 | Wt 199.0 lb

## 2019-08-07 DIAGNOSIS — O358XX0 Maternal care for other (suspected) fetal abnormality and damage, not applicable or unspecified: Secondary | ICD-10-CM

## 2019-08-07 DIAGNOSIS — Z3A17 17 weeks gestation of pregnancy: Secondary | ICD-10-CM | POA: Diagnosis not present

## 2019-08-07 DIAGNOSIS — O99210 Obesity complicating pregnancy, unspecified trimester: Secondary | ICD-10-CM

## 2019-08-07 DIAGNOSIS — O0990 Supervision of high risk pregnancy, unspecified, unspecified trimester: Secondary | ICD-10-CM

## 2019-08-07 DIAGNOSIS — O0992 Supervision of high risk pregnancy, unspecified, second trimester: Secondary | ICD-10-CM

## 2019-08-07 DIAGNOSIS — O99212 Obesity complicating pregnancy, second trimester: Secondary | ICD-10-CM

## 2019-08-07 NOTE — Progress Notes (Signed)
Hands are swelling and going numb

## 2019-08-07 NOTE — BH Specialist Note (Deleted)
Integrated Behavioral Health via Telemedicine Video Visit  08/07/2019 [name] [id number]  Number of Integrated Behavioral Health visits: 2 Session Start time: 10:45***  Session End time: 11:15*** Total time: {IBH Total YJEH:63149702}  Referring Provider: Caren Macadam, MD Type of Visit: Video Patient/Family location: Home [facility] Provider location: [facility] All persons participating in visit: Patient Kelly Lopez and [place]  Confirmed patient's address: Yes  Confirmed patient's phone number: Yes  Any changes to demographics: No   Confirmed patient's insurance: Yes  Any changes to patient's insurance: No   Discussed confidentiality: At previous visit ***  I connected with Kelly Lopez  by a video enabled telemedicine application and verified that I am speaking with the correct person using two identifiers.     I discussed the limitations of evaluation and management by telemedicine and the availability of in person appointments.  I discussed that the purpose of this visit is to provide behavioral health care while limiting exposure to the novel coronavirus.   Discussed there is a possibility of technology failure and discussed alternative modes of communication if that failure occurs.  I discussed that engaging in this video visit, they consent to the provision of behavioral healthcare and the services will be billed under their insurance.  Patient and/or legal guardian expressed understanding and consented to video visit: Yes   PRESENTING CONCERNS: Patient and/or family reports the following symptoms/concerns: *** Duration of problem: Current pregnancy; Severity of problem: moderate***  STRENGTHS (Protective Factors/Coping Skills): Social support; open to treatment  GOALS ADDRESSED: Patient will: 1.  Reduce symptoms of: anxiety, depression and stress *** 2.  Increase knowledge and/or ability of: {IBH Patient Tools:21014057}  3.  Demonstrate  ability to: {IBH Goals:21014053}  INTERVENTIONS: Interventions utilized:  {IBH Interventions:21014054} Standardized Assessments completed: {IBH Screening Tools:21014051}  ASSESSMENT: Patient currently experiencing Adjustment disorder with mixed depression and anxiety ***.   Patient may benefit from continued ***.  PLAN: 1. Follow up with behavioral health clinician on : *** 2. Behavioral recommendations:  -*** -***(Effexor, walk trail/indoors on day off, calm, journal)*** 3. Referral(s): {IBH Referrals:21014055}  I discussed the assessment and treatment plan with the patient and/or parent/guardian. They were provided an opportunity to ask questions and all were answered. They agreed with the plan and demonstrated an understanding of the instructions.   They were advised to call back or seek an in-person evaluation if the symptoms worsen or if the condition fails to improve as anticipated.  Kelly Lopez

## 2019-08-07 NOTE — Progress Notes (Signed)
   PRENATAL VISIT NOTE  Subjective:  Kelly Lopez is a 38 y.o. G3P0020 at [redacted]w[redacted]d being seen today for ongoing prenatal care.  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  Patient reports no complaints.  Contractions: Not present. Vag. Bleeding: None.   . Denies leaking of fluid.   The following portions of the patient's history were reviewed and updated as appropriate: allergies, current medications, past family history, past medical history, past social history, past surgical history and problem list.   Objective:   Vitals:   08/07/19 0939  BP: 115/79  Pulse: (!) 101  Weight: 199 lb (90.3 kg)    Fetal Status: Fetal Heart Rate (bpm): 157         General:  Alert, oriented and cooperative. Patient is in no acute distress.  Skin: Skin is warm and dry. No rash noted.   Cardiovascular: Normal heart rate noted  Respiratory: Normal respiratory effort, no problems with respiration noted  Abdomen: Soft, gravid, appropriate for gestational age.  Pain/Pressure: Absent     Pelvic: Cervical exam deferred        Extremities: Normal range of motion.  Edema: Trace  Mental Status: Normal mood and affect. Normal behavior. Normal judgment and thought content.   Assessment and Plan:  Pregnancy: O1Y0737 at [redacted]w[redacted]d 1. Supervision of high risk pregnancy, antepartum   2. Obesity affecting pregnancy, antepartum - rec'd less than 20 pound weight gain - Hemoglobin A1c  3. 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 Please refer to After Visit Summary for other counseling recommendations.   No follow-ups on file.  Future Appointments  Date Time Provider [place]  08/10/2019 10:45 AM [place]  08/10/2019  1:00 PM [facility] [place] 3 [facility]-MFCUS MFC-US    Emily Filbert, MD

## 2019-08-08 LAB — HEMOGLOBIN A1C
Est. average glucose Bld gHb Est-mCnc: 85 mg/dL
Hgb A1c MFr Bld: 4.6 % — ABNORMAL LOW (ref 4.8–5.6)

## 2019-08-10 ENCOUNTER — Encounter (HOSPITAL_COMMUNITY): Payer: Self-pay

## 2019-08-10 ENCOUNTER — Ambulatory Visit: Payer: Commercial Managed Care - PPO

## 2019-08-10 ENCOUNTER — Other Ambulatory Visit: Payer: Self-pay | Admitting: Family Medicine

## 2019-08-10 ENCOUNTER — Ambulatory Visit (HOSPITAL_COMMUNITY)
Admission: RE | Admit: 2019-08-10 | Discharge: 2019-08-10 | Disposition: A | Discharge status: Home / Self Care | Payer: No Typology Code available for payment source | Source: Ambulatory Visit | Attending: Obstetrics and Gynecology | Admitting: Obstetrics and Gynecology

## 2019-08-10 ENCOUNTER — Ambulatory Visit (HOSPITAL_BASED_OUTPATIENT_CLINIC_OR_DEPARTMENT_OTHER): Payer: No Typology Code available for payment source | Admitting: *Deleted

## 2019-08-10 ENCOUNTER — Ambulatory Visit (HOSPITAL_COMMUNITY): Payer: No Typology Code available for payment source

## 2019-08-10 ENCOUNTER — Other Ambulatory Visit: Payer: Self-pay

## 2019-08-10 DIAGNOSIS — O09522 Supervision of elderly multigravida, second trimester: Secondary | ICD-10-CM | POA: Insufficient documentation

## 2019-08-10 DIAGNOSIS — Z3A18 18 weeks gestation of pregnancy: Secondary | ICD-10-CM | POA: Diagnosis present

## 2019-08-10 DIAGNOSIS — O99210 Obesity complicating pregnancy, unspecified trimester: Secondary | ICD-10-CM | POA: Diagnosis present

## 2019-08-10 DIAGNOSIS — O358XX0 Maternal care for other (suspected) fetal abnormality and damage, not applicable or unspecified: Secondary | ICD-10-CM

## 2019-08-10 DIAGNOSIS — O0990 Supervision of high risk pregnancy, unspecified, unspecified trimester: Secondary | ICD-10-CM

## 2019-08-10 NOTE — Progress Notes (Signed)
This patient was seen for a detailed ultrasound as a cystic hygroma was noted during her first trimester ultrasound performed at [place].  The patient reports that this is her third pregnancy.  She had a prior elective termination of pregnancy and a first trimester miscarriage with her prior 2 pregnancies.  She denies any significant past medical history and denies any problems in her current pregnancy.  She reports that she has not received any counseling regarding the significance of the cystic hygroma.  She had a cell free DNA test which indicated a low risk for trisomy 21, 18, and 13.  A female fetus is predicted.  On today's exam, a large cystic hygroma along with fetal hydrops was noted.  The fetal growth and amniotic fluid level appeared appropriate for her gestational age.  The patient was advised that a cystic hygroma may be seen in fetuses with a chromosomal abnormality or a congenital heart defect.  It can also be seen in fetuses without a chromosomal abnormality.  She was advised that some cystic hygromas that are noted in the first trimester may resolve in the second trimester.  The prognosis for a successful pregnancy if a cystic hygroma is seen with fetal hydrops in the second trimester is usually poor.    As fetal hydrops was noted today along with the large cystic hygroma, the patient was advised that she will most likely have a fetal demise within the next few weeks.  Due to the increased risk of a chromosomal abnormality with a cystic hygroma, the patient was advised that an amniocentesis is recommended for definitive diagnosis of fetal aneuploidy.  After a complete discussion of the risks (including a miscarriage rate of 1 in 300) and benefits of the procedure, the patient consented to have the amniocentesis performed today.  An uncomplicated genetic amniocentesis was performed today obtaining 30 cc of straw-colored amniotic fluid which was sent off for amniotic fluid AFP, FISH,  and chromosome analysis with reflex MicroArray testing.  The patient was advised that our genetic counselor will notify her regarding the results of the amniocentesis.  Post amniocentesis instructions were discussed.  As the patient's blood type is Rh positive, a dose of RhoGam was not given following the procedure.  The option for termination of pregnancy due to the poor prognosis was also discussed today.  The patient was advised that she should let us know sometime next week regarding her decision.  The patient will await the [facility] results before making a final decision.  Should the patient continue with her pregnancy, we will continue to follow her with serial ultrasounds.  A fetal echocardiogram will also be ordered later in her pregnancy.  A total of 30 minutes was spent counseling and coordinating the care for this patient.  Greater than 50% of the time was spent in direct face-to-face contact.

## 2019-08-13 ENCOUNTER — Telehealth (HOSPITAL_COMMUNITY): Payer: Self-pay | Admitting: *Deleted

## 2019-08-13 NOTE — Telephone Encounter (Signed)
Called to follow up with Kelly Lopez following amniocentesis. Pt having no problems post procedure, but emotionally not doing well. Explained to her that we can connect her with someone to talk to if she would like, she voiced understanding. Will wait for results from amniocentesis.

## 2019-08-14 ENCOUNTER — Telehealth (HOSPITAL_COMMUNITY): Payer: Self-pay | Admitting: Genetic Counselor

## 2019-08-14 NOTE — Telephone Encounter (Addendum)
I called Ms. 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 No numerical abnormalities were identified for chromosomes 13,18, or 21, reducing the likelihood of trisomy 74, trisomy18, and trisomy 21 in the fetus. I informed Ms.Rumplethat while FISH results are not considered diagnostic, this result is consistent with her ultrasound findings of a cystic hygroma and hydrops. We discussed that fetal karyotype on the amniocentesis sample is still being completed. While it is expected that karyotype will confirm the results from [facility], there is the possibility of something else being identified on karyotype, such as a chromosomal rearrangement.  We discussed that fetuses with Turner syndrome may present prenatally with increased nuchal translucency, cystic hygroma, hydrops, cardiac defects, and renal anomalies on ultrasound. Additionally, there is up to a 99% risk for miscarriage in pregnancies affected with Turner syndrome. Given the ultrasound finding of hydrops, Ms. Helvie had previously been counseled by Dr. Annamaria Boots that prognosis is likely expected to be poor. The risk of miscarriage is highest in the first trimester, though second trimester fetal demises and stillbirths have been reported. Females who survive the prenatal period and are born with Turner syndrome typically have short stature and early ovarian failure resulting in infertility. Affected individuals may also present with webbed necks, low posterior hairlines, low-set ears, broad chests, cardiac defects, renal abnormalities, thyroid disease, recurrent otitis media, and hearing loss. Intelligence is typically normal, but individuals may have developmental delay, nonverbal learning disabilities, and behavioral problems. Symptoms can vary widely among individuals.   Monosomy X typically results from the loss of one X chromosome in affected females. I reassured the couple  that there is nothing that they did or did not do to cause this. Rather, Turner syndrome oftenoccursdue to arandom error in chromosomal divisionduring the formation of reproductive cells in a process called nondisjunction. Rarely, Turner syndrome may be caused by a partial deletion of an X chromosome, which can be inherited from a parent. Since Turner syndrome is most often not inherited, recurrence is rare; however, cases of recurrence have been reported. The risk for Turner syndrome does not increase with maternal age unlike some other chromosomal aneuploidies. I informed Ms. Doorn that we would discuss recurrence risks in more detail when results from karyotype are returned.  Ms. Riche and her husband informed me that they were not considering terminating the pregnancy and preferred to continue for as long as the pregnancy would allow. They both understand that a loss may occur due to the findings identified on ultrasound and the prognosis associated with many prenatal cases of Turner syndrome. However, given that many individuals who are born with Turner syndrome are able to live happy, healthy, independent lives, the couple expressed that they want to hold on to that hope for as long as possible while also remaining realistic about the pregnancy. Given that the couple has opted to continue the pregnancy, Dr. Annamaria Boots recommended that Ms. Scannell return for a follow-up ultrasound on 12/4.  I offered to send the couple additional information about Turner syndrome following our conversation, which they desired. I sent Ms. Cammack an email with links to several resources, including information from the [employer] ([employer]) and the [facility]. I also sent her links to guides from the Turner Syndrome Foundation and the Turner Syndrome Society of the [place] discussing what to expect after receiving a prenatal diagnosis of Turner syndrome.   The couple was appropriately emotional learning  of this test result. I validatedthat it can be difficult to hear a diagnosis ofTurner syndromein the pregnancy and that it would be very normal for anyone in thecouple'ssituation to feel upset or overwhelmed with a number of emotions. I also validated their decision to continue the pregnancy, as Ms. Frankum disclosed that she had experienced a devastating miscarriage earlier this year and would not consider termination unless the fetus's diagnosis was severely life-limiting. I informed Ms. Pink that we can set up an in-person genetic counseling appointment to discuss these results in more detail at her next ultrasound appointment if that would be helpful. I also encouraged her to reach out to me via phone or email if she has any questions prior to me calling her with results from karyotype analysis. [address] will call Ms.Culbertson to answer any questions she may have and discuss recurrence risks and options for future pregnancies as soon as I receive the karyotype results.  Gershon Crane, MS Genetic Counselor

## 2019-08-15 ENCOUNTER — Other Ambulatory Visit (HOSPITAL_COMMUNITY): Payer: Self-pay | Admitting: *Deleted

## 2019-08-15 DIAGNOSIS — O358XX0 Maternal care for other (suspected) fetal abnormality and damage, not applicable or unspecified: Secondary | ICD-10-CM

## 2019-08-21 ENCOUNTER — Telehealth (HOSPITAL_COMMUNITY): Payer: Self-pay | Admitting: Genetic Counselor

## 2019-08-21 NOTE — Telephone Encounter (Signed)
LVM for Ms. Petrenko indicating that I have her final testing (karyotype) results. Requested a call back to my direct line to discuss these in more detail, as no identifiers were provided in voicemail message.   Buelah Manis, MS Genetic Counselor

## 2019-08-22 ENCOUNTER — Telehealth (HOSPITAL_COMMUNITY): Payer: Self-pay | Admitting: Genetic Counselor

## 2019-08-22 NOTE — Telephone Encounter (Signed)
I called Ms. Ohagan and her husband Aaron Edelman to discuss her positive karyotype results on amniocentesis that confirmed monosomy X, AKA Turner syndrome, in the current fetus. See previous telephone note that describes Turner syndrome in more detail.   We reviewed that chromosome analysis confirmed 45,X in every cell karyotyped in the fetus. This result indicates that Turner syndrome likely occurred due to chance in this fetus, ruling out the possibility of an inherited partial deletion. For this reason, recurrence is likely low, up to 1% due to the possibility of germline mosaicism. I reminded the couple that the risk for Turner syndrome does not increase with maternal age as it does with some other chromosomal aneuploidies.  I inquired about how the couple has been feeling since receiving their FISH results. Ms. Luallen disclosed that she has gotten over being angry and has moved onto feeling numb. She feels that she is "waiting for the inevitable to happen" by anticipating a miscarriage. This diagnosis has brought back memories of the couple's miscarriage they experienced earlier this year, which has been challenging. I validated that this year has been difficult for the couple and that it is completely reasonable to feel many overwhelming emotions after receiving such heavy information. We discussed that grief is not a linear process, and it is normal to continue grieving the last pregnancy while also grieving the loss of a "typical" pregnancy with the current fetus. Ms. Knipp also expressed concern that her COVID diagnosis around the time of conception could have caused the fetus's cystic hygroma, as she read that some viruses can be associated with such an ultrasound finding. I indicated that cystic hygromas are very common features associated with Turner syndrome and that COVID did not contribute to the diagnosis of Turner syndrome for the pregnancy. I assured the couple that there is nothing they did or did  not do to cause this diagnosis.  Ms. Ramaker is scheduled for an ultrasound this Friday. We discussed that this ultrasound may provide some clarity as to what to expect for the rest of the pregnancy. Mr. Boutelle expressed that while they likely will not pursue termination, it is also extremely difficult to continue waiting to see if they will experience a miscarriage. I offered to meet with the couple following their ultrasound on Friday to discuss this diagnosis and their possible pregnancy management options in detail. The couple expressed that that would be helpful and we agreed upon this plan.  Buelah Manis, MS Genetic Counselor

## 2019-08-24 ENCOUNTER — Other Ambulatory Visit: Payer: Self-pay

## 2019-08-24 ENCOUNTER — Encounter (HOSPITAL_COMMUNITY): Payer: Self-pay

## 2019-08-24 ENCOUNTER — Ambulatory Visit (HOSPITAL_COMMUNITY)
Admission: RE | Admit: 2019-08-24 | Discharge: 2019-08-24 | Disposition: A | Discharge status: Home / Self Care | Payer: No Typology Code available for payment source | Source: Ambulatory Visit | Attending: Obstetrics | Admitting: Obstetrics

## 2019-08-24 ENCOUNTER — Ambulatory Visit (HOSPITAL_COMMUNITY): Payer: No Typology Code available for payment source | Admitting: *Deleted

## 2019-08-24 ENCOUNTER — Telehealth: Payer: Self-pay | Admitting: Obstetrics and Gynecology

## 2019-08-24 ENCOUNTER — Telehealth (HOSPITAL_COMMUNITY): Payer: Self-pay | Admitting: Genetic Counselor

## 2019-08-24 DIAGNOSIS — Z3A20 20 weeks gestation of pregnancy: Secondary | ICD-10-CM

## 2019-08-24 DIAGNOSIS — O358XX0 Maternal care for other (suspected) fetal abnormality and damage, not applicable or unspecified: Secondary | ICD-10-CM

## 2019-08-24 DIAGNOSIS — O09522 Supervision of elderly multigravida, second trimester: Secondary | ICD-10-CM | POA: Diagnosis not present

## 2019-08-24 DIAGNOSIS — O359XX0 Maternal care for (suspected) fetal abnormality and damage, unspecified, not applicable or unspecified: Secondary | ICD-10-CM

## 2019-08-24 DIAGNOSIS — Z362 Encounter for other antenatal screening follow-up: Secondary | ICD-10-CM | POA: Diagnosis not present

## 2019-08-24 DIAGNOSIS — O351XX0 Maternal care for (suspected) chromosomal abnormality in fetus, not applicable or unspecified: Secondary | ICD-10-CM | POA: Diagnosis not present

## 2019-08-24 DIAGNOSIS — O99210 Obesity complicating pregnancy, unspecified trimester: Secondary | ICD-10-CM | POA: Diagnosis present

## 2019-08-24 DIAGNOSIS — O0990 Supervision of high risk pregnancy, unspecified, unspecified trimester: Secondary | ICD-10-CM | POA: Diagnosis present

## 2019-08-24 NOTE — Progress Notes (Signed)
This patient was seen for a follow-up exam due to a fetus with hydrops and cystic hygroma.  Her amniocentesis results confirmed that the fetus has Turner syndrome (23 XO).  The patient has received extensive counseling with our genetic counselor regarding Turner syndrome.  She denies any problems since her last exam.  On today's exam, the overall EFW measures at less than the 1st percentile for her gestational age.  There was normal amniotic fluid noted.  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  Fetal hydrops with skin edema, pericardial and pleural effusions, along with abdominal ascites continues to be noted.  The fetal hydrops appears worse than compared to her last exam.  A possible fetal heart defect was also noted today.  The poor prognosis for her pregnancy due to the large cystic hygroma and the worsening of fetal hydrops was discussed with the patient and her husband.  They were advised that I anticipate that a fetal demise will most likely occur later in her pregnancy.  Management options for her pregnancy including termination of pregnancy versus continued expectant management were discussed again today.  After a complete discussion, the patient and her husband stated that due to the poor prognosis, they would like to proceed with a termination of pregnancy via a Cytotec induction.  Our genetic counselor will help with the patient arrange for the termination procedure as soon as possible.  I advised the patient that I support their decision to proceed with termination.  They were reassured that the risk of recurrence of Turner syndrome with a cystic hygroma is quite low.  I reassured them that I anticipate that she will most likely have a normal pregnancy in the future.  A possible succenturiate lobe of the placenta was noted today.  Care should be taken to ensure that all pieces of the placenta are delivered at the time of induction.

## 2019-08-24 NOTE — Telephone Encounter (Signed)
I called Ms. Kelly Lopez to assist in coordinating her induction procedure. After undergoing her ultrasound today and consulting about the fetus's poor/worsening prognosis with Dr. Annamaria Boots, Ms. Kelly Lopez and her husband opted to undergo termination of pregnancy via induction. This option will allow them to say goodbye and make some memories with their daughter. Prior to my phone call with Ms. Kelly Lopez, I called the OB on call (Dr. Rosana Hoes) to determine if a provider would be willing to perform the induction next week. Dr. Rosana Hoes agreed to investigate this and indicated that someone would be in touch with the patient to get her scheduled for her induction.  Ms. Kelly Lopez and I reviewed the induction by termination procedure, including expectations and possible complications. We also reviewed the 72-hour consent law for terminations in [place] and the Woman's Right to Know Act. Ms. Kelly Lopez agreed to sign off on the Abortion Certification consent form after we had reviewed it together. Following our discussion, I emailed the form to her to sign and requested that she email back a signed copy to me. Once I receive this, I will sign off on the form and have it scanned into her chart.  Ms. Kelly Lopez had no further questions about Turner syndrome or the induction procedure. However, she did inquire about therapists that are available to establish care with. She noted that she had met with someone from [facility], but she was hoping to find a therapist she could meet with to address all of her mental health-related issues rather than ones solely related to the pregnancy. I agreed to reach out to Social Work and research therapists in the area to provide Ms. Kelly Lopez with some options. Ms. Kelly Lopez had no further questions.  Buelah Manis, MS Genetic Counselor

## 2019-08-24 NOTE — Telephone Encounter (Signed)
Received call from [facility], genetic counselor with [facility]. Patient is [redacted]w[redacted]d with fetus with Monosomy X, large cystic hygroma, significant fetal hydrops, possible heart anomaly. Seen today for ultrasound and after counseling at [facility], has opted for termination via cytotec induction. Signed 72 hr state papers today with [facility]. Patient is scheduled for induction next Thursday, December 10. She will have an office visit on Tuesday prior to this induction date.   I relayed the above information to the patient after verifying identity x2. She verbalizes understanding of the above and is in agreement with plan. Answered all questions. She will call with any issues/concerns.   Feliz Beam, M.D. Attending Center for [employer] [facility])

## 2019-08-26 ENCOUNTER — Other Ambulatory Visit: Payer: Self-pay | Admitting: Family Medicine

## 2019-08-26 ENCOUNTER — Other Ambulatory Visit: Payer: Self-pay | Admitting: Obstetrics & Gynecology

## 2019-08-27 ENCOUNTER — Telehealth (HOSPITAL_COMMUNITY): Payer: Self-pay | Admitting: *Deleted

## 2019-08-27 NOTE — Telephone Encounter (Signed)
Preadmission screen  

## 2019-08-28 ENCOUNTER — Other Ambulatory Visit: Payer: Self-pay | Admitting: Advanced Practice Midwife

## 2019-08-28 ENCOUNTER — Other Ambulatory Visit (HOSPITAL_COMMUNITY)
Admission: RE | Admit: 2019-08-28 | Discharge: 2019-08-28 | Disposition: A | Discharge status: Home / Self Care | Payer: No Typology Code available for payment source | Source: Ambulatory Visit | Attending: Obstetrics & Gynecology | Admitting: Obstetrics & Gynecology

## 2019-08-28 ENCOUNTER — Encounter: Payer: Self-pay | Admitting: Obstetrics & Gynecology

## 2019-08-28 ENCOUNTER — Telehealth (INDEPENDENT_AMBULATORY_CARE_PROVIDER_SITE_OTHER): Payer: No Typology Code available for payment source | Admitting: Obstetrics and Gynecology

## 2019-08-28 ENCOUNTER — Other Ambulatory Visit: Payer: Self-pay

## 2019-08-28 DIAGNOSIS — O358XX0 Maternal care for other (suspected) fetal abnormality and damage, not applicable or unspecified: Secondary | ICD-10-CM

## 2019-08-28 DIAGNOSIS — Z3A20 20 weeks gestation of pregnancy: Secondary | ICD-10-CM

## 2019-08-28 DIAGNOSIS — O99210 Obesity complicating pregnancy, unspecified trimester: Secondary | ICD-10-CM

## 2019-08-28 DIAGNOSIS — O09522 Supervision of elderly multigravida, second trimester: Secondary | ICD-10-CM

## 2019-08-28 DIAGNOSIS — O99212 Obesity complicating pregnancy, second trimester: Secondary | ICD-10-CM | POA: Diagnosis not present

## 2019-08-28 DIAGNOSIS — O0992 Supervision of high risk pregnancy, unspecified, second trimester: Secondary | ICD-10-CM

## 2019-08-28 DIAGNOSIS — O0990 Supervision of high risk pregnancy, unspecified, unspecified trimester: Secondary | ICD-10-CM

## 2019-08-28 LAB — SARS CORONAVIRUS 2 (TAT 6-24 HRS): SARS Coronavirus 2: NEGATIVE

## 2019-08-28 NOTE — Progress Notes (Signed)
TELEHEALTH OBSTETRICS PRENATAL VIRTUAL VIDEO VISIT ENCOUNTER NOTE  Provider [facility]: [facility]   I connected with Cathalina D Sproles on 08/28/19 at 10:15 AM EST by MyChart Video Encounter at home and verified that I am speaking with the correct person using two identifiers.   I discussed the limitations, risks, security and privacy concerns of performing an evaluation and management service virtually and the availability of in person appointments. I also discussed with the patient that there may be a patient responsible charge related to this service. The patient expressed understanding and agreed to proceed. Subjective:  Rakesha Dalporto Behring is a 38 y.o. G3P0020 at [redacted]w[redacted]d being seen today for ongoing prenatal care.  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  Patient reports no complaints.  Contractions: Not present. Vag. Bleeding: None.   . Denies any leaking of fluid.   The following portions of the patient's history were reviewed and updated as appropriate: allergies, current medications, past family history, past medical history, past social history, past surgical history and problem list.   Objective:  There were no vitals filed for this visit.  Fetal Status:           General:  Alert, oriented and cooperative. Patient is in no acute distress.  Respiratory: Normal respiratory effort, no problems with respiration noted  Mental Status: Normal mood and affect. Normal behavior. Normal judgment and thought content.  Rest  of physical exam deferred due to type of encounter  Imaging: [place] Mfm Amniocentesis  Result Date: 08/10/2019 ----------------------------------------------------------------------  OBSTETRICS REPORT                       (Signed Final 08/10/2019 05:00 pm) ---------------------------------------------------------------------- Patient Info  ID #:       [id number]                          D.O.B.:  Feb 04, 1981 (38 yrs)  Name:       MALLERIE BLOK Heaton               Visit Date: 08/10/2019 01:26 pm ---------------------------------------------------------------------- Performed By  Performed By:     Marcellina Millin          Ref. Address:     [address]                    RDMS                                                             [address]  Attending:        Ma Rings MD         [facility]  [facility] By[facility]      [facility] ---------------------------------------------------------------------- Orders   #  Description                          Code         Ordered By   1  US MFM AMNIOCENTESIS                 [id number]     Lyndel SafeKIMBERLY [place]   2  US MFM OB DETAIL +14 WK              76811.01     [facility]  ----------------------------------------------------------------------   #  Order #                    Accession #                 Episode #   1  [id number]                  [id number]   2  [id number]                  [id number]                  [id number]  ---------------------------------------------------------------------- Indications   Cystic hygroma                                 O35.288XX9   Advanced maternal age multigravida 4735+,        34O09.522   second trimester   2618 weeks gestation of pregnancy                Z3A.18   Encounter for antenatal screening for          Z36.3   malformations (low risk)  ---------------------------------------------------------------------- Fetal Evaluation  Num Of  Fetuses:         1  Fetal Heart Rate(bpm):  150  Cardiac Activity:       Observed  Presentation:           Breech  Placenta:               Posterior Previa, succenturiate lobe  P. Cord Insertion:      Marginal insertion  Amniotic Fluid  AFI FV:      Subjectively low-normal                              Largest Pocket(cm)                              2.98 ---------------------------------------------------------------------- Biometry  BPD:      39.7  mm     G. Age:  18w 0d         47  %    CI:        89.59   %    70 - 86  FL/HC:      14.1   %    15.8 - 18  HC:      131.9  mm     G. Age:  16w 5d          2  %    HC/AC:      0.85        1.07 - 1.29  AC:      155.5  mm     G. Age:  20w 5d         99  %    FL/BPD:     46.9   %  FL:       18.6  mm     G. Age:  15w 4d        < 1  %    FL/AC:      12.0   %    20 - 24  HUM:      19.5  mm     G. Age:  15w 5d        < 5  %  Est. FW:     225  gm      0 lb 8 oz     44  % ---------------------------------------------------------------------- OB History  Gravidity:    3         Term:   0        Prem:   0        SAB:   1  TOP:          1       Ectopic:  0        Living: 0 ---------------------------------------------------------------------- Gestational Age  U/S Today:     17w 5d                                        EDD:   01/13/20  Best:          18w 1d     Det. By:  Marcella Dubs         EDD:   01/10/20                                      (06/19/19) ---------------------------------------------------------------------- Anatomy  Cranium:               Skin edema             Aortic Arch:            Not well visualized  Cavum:                 Not well visualized    Ductal Arch:            Not well visualized  Ventricles:            Not well visualized    Diaphragm:              Not well visualized  Choroid Plexus:        Appears normal         Stomach:                Appears normal, left  sided  Cerebellum:            Not well visualized    Abdomen:                Abnormal, see                                                                        comments  Posterior Fossa:       Not well visualized    Abdominal Wall:         Not well visualized  Nuchal Fold:           Cystic hygroma         Cord Vessels:           Not well visualized  Face:                  Not well visualized    Kidneys:                Not well visualized  Lips:                  Not well visualized    Bladder:                Appears normal  Thoracic:              Appears normal         Spine:                  Appears normal  Heart:                 Not well visualized    Upper Extremities:      Visualized  RVOT:                  Not well visualized    Lower Extremities:      Visualized  LVOT:                  Not well visualized  Other:  Technically difficult due to fetal position. ---------------------------------------------------------------------- Guided Procedures  Type:   Amniocentesis  FH Post Procedure:     Normal             RH Type:          A+  Rh Immune Globulin:    Not required,      Discharge Inst.:  Post-procedure                         Rh positive                          instructions                                                              given  Needle Insertions:     20 gauge x 1       Vol. Withdrawn:   30 ml  of clear                                                              amniotic fluid  Transabdominal:        Yes  Complications:  None  Comment:                    Informed consent was obtained. A "time-out" was performed before the procedure. Patient                              tolerated the procedure well. We gave her post-procedure instructions. ---------------------------------------------------------------------- Cervix Uterus Adnexa  Cervix  Length:            4.2  cm.  Normal appearance by transabdominal scan.  ---------------------------------------------------------------------- Comments  This patient was seen for a detailed ultrasound as a cystic  hygroma was noted during her first trimester ultrasound  performed at [facility].  The patient reports that this is  her third pregnancy.  She had a prior elective termination of  pregnancy and a first trimester miscarriage with her prior 2  pregnancies.  She denies any significant past medical history  and denies any problems in her current pregnancy.  She  reports that she has not received any counseling regarding  the significance of the cystic hygroma.  She had a cell free DNA test which indicated a low risk for  trisomy 21, 18, and 13.  A female fetus is predicted.  On today's exam, a large cystic hygroma along with fetal  hydrops was noted.  The fetal growth and amniotic fluid level  appeared appropriate for her gestational age.  The patient was advised that a cystic hygroma may be seen  in fetuses with a chromosomal abnormality or a congenital  heart defect.  It can also be seen in fetuses without a  chromosomal abnormality.  She was advised that some cystic  hygromas that are noted in the first trimester may resolve in  the second trimester.  The prognosis for a successful  pregnancy if a cystic hygroma is seen with fetal hydrops in  the second trimester is usually poor.  As fetal hydrops was noted today along with the large cystic  hygroma, the patient was advised that she will most likely  have a fetal demise within the next few weeks.  Due to the increased risk of a chromosomal abnormality with  a cystic hygroma, the patient was advised that an  amniocentesis is recommended for definitive diagnosis of  fetal aneuploidy.  After a complete discussion of the risks  (including a miscarriage rate of 1 in 300) and benefits of the  procedure, the patient consented to have the amniocentesis  performed today.  An uncomplicated genetic amniocentesis was performed  today  obtaining 30 cc of straw-colored amniotic fluid which  was sent off for amniotic fluid AFP, FISH, and chromosome  analysis with reflex MicroArray testing.  The patient was  advised that our genetic counselor will notify her regarding  the results of the amniocentesis.  Post amniocentesis  instructions were discussed.  As the patient's blood type is  Rh positive, a dose of RhoGam was not given following the  procedure.  The  option for termination of pregnancy due to the poor  prognosis was also discussed today.  The patient was  advised that she should let us know sometime next week  regarding her decision.  The patient will await the [facility]  results before making a final decision.  Should the patient continue with her pregnancy, we will  continue to follow her with serial ultrasounds.  A fetal  echocardiogram will also be ordered later in her pregnancy.  A total of 30 minutes was spent counseling and coordinating  the care for this patient.  Greater than 50% of the time was  spent in direct face-to-face contact. ----------------------------------------------------------------------                   Johnell Comings, MD Electronically Signed Final Report   08/10/2019 05:00 pm ----------------------------------------------------------------------  [place] Mfm Ob Detail +14 Wk  Result Date: 08/10/2019 ----------------------------------------------------------------------  OBSTETRICS REPORT                       (Signed Final 08/10/2019 05:00 pm) ---------------------------------------------------------------------- Patient Info  ID #:       [id number]                          D.O.B.:  Sep 13, 1981 (38 yrs)  Name:       TYQUASIA PANT Lax               Visit Date: 08/10/2019 01:26 pm ---------------------------------------------------------------------- Performed By  Performed By:     Berlinda Last          Ref. Address:     [address]                                                             [address]   Attending:        Johnell Comings MD         [facility]  Referred By:      [facility] ---------------------------------------------------------------------- Orders   #  Description                          Code         Ordered By   1  [place] MFM AMNIOCENTESIS                 [phone]     Lauretta Chester   2  [place] MFM OB DETAIL +14 WK              76811.01     [facility] [place]  ----------------------------------------------------------------------   #  Order #  Accession #                 Episode #   1  [id number]                  [id number]   2  [id number]                  [id number]                  [id number]  ---------------------------------------------------------------------- Indications   Cystic hygroma                                 O35.30XX9   Advanced maternal age multigravida 59+,        O62.522   second trimester   [redacted] weeks gestation of pregnancy                Z3A.18   Encounter for antenatal screening for          Z36.3   malformations (low risk)  ---------------------------------------------------------------------- Fetal Evaluation  Num Of Fetuses:         1  Fetal Heart Rate(bpm):  150  Cardiac Activity:       Observed  Presentation:           Breech  Placenta:               Posterior Previa, succenturiate lobe  P. Cord Insertion:      Marginal insertion  Amniotic Fluid  AFI FV:      Subjectively low-normal                              Largest Pocket(cm)                              2.98 ---------------------------------------------------------------------- Biometry  BPD:      39.7  mm     G. Age:  18w 0d         47  %    CI:        89.59   %    70 - 86                                                          FL/HC:      14.1   %    15.8 - 18  HC:      131.9  mm     G. Age:  16w 5d          2  %    HC/AC:      0.85        1.07 - 1.29  AC:      155.5  mm     G. Age:  20w 5d          99  %    FL/BPD:     46.9   %  FL:       18.6  mm     G. Age:  15w 4d        <  1  %    FL/AC:      12.0   %    20 - 24  HUM:      19.5  mm     G. Age:  15w 5d        < 5  %  Est. FW:     225  gm      0 lb 8 oz     44  % ---------------------------------------------------------------------- OB History  Gravidity:    3         Term:   0        Prem:   0        SAB:   1  TOP:          1       Ectopic:  0        Living: 0 ---------------------------------------------------------------------- Gestational Age  U/S Today:     17w 5d                                        EDD:   01/13/20  Best:          18w 1d     Det. ByMarcella Dubs         EDD:   01/10/20                                      (06/19/19) ---------------------------------------------------------------------- Anatomy  Cranium:               Skin edema             Aortic Arch:            Not well visualized  Cavum:                 Not well visualized    Ductal Arch:            Not well visualized  Ventricles:            Not well visualized    Diaphragm:              Not well visualized  Choroid Plexus:        Appears normal         Stomach:                Appears normal, left                                                                        sided  Cerebellum:            Not well visualized    Abdomen:                Abnormal, see  comments  Posterior Fossa:       Not well visualized    Abdominal Wall:         Not well visualized  Nuchal Fold:           Cystic hygroma         Cord Vessels:           Not well visualized  Face:                  Not well visualized    Kidneys:                Not well visualized  Lips:                  Not well visualized    Bladder:                Appears normal  Thoracic:              Appears normal         Spine:                  Appears normal  Heart:                 Not well visualized    Upper Extremities:      Visualized  RVOT:                   Not well visualized    Lower Extremities:      Visualized  LVOT:                  Not well visualized  Other:  Technically difficult due to fetal position. ---------------------------------------------------------------------- Guided Procedures  Type:   Amniocentesis  FH Post Procedure:     Normal             RH Type:          A+  Rh Immune Globulin:    Not required,      Discharge Inst.:  Post-procedure                         Rh positive                          instructions                                                              given  Needle Insertions:     20 gauge x 1       Vol. Withdrawn:   30 ml of clear                                                              amniotic fluid  Transabdominal:        Yes  Complications:  None  Comment:                    Informed consent was obtained. A "time-out" was performed  before the procedure. Patient                              tolerated the procedure well. We gave her post-procedure instructions. ---------------------------------------------------------------------- Cervix Uterus Adnexa  Cervix  Length:            4.2  cm.  Normal appearance by transabdominal scan. ---------------------------------------------------------------------- Comments  This patient was seen for a detailed ultrasound as a cystic  hygroma was noted during her first trimester ultrasound  performed at [facility].  The patient reports that this is  her third pregnancy.  She had a prior elective termination of  pregnancy and a first trimester miscarriage with her prior 2  pregnancies.  She denies any significant past medical history  and denies any problems in her current pregnancy.  She  reports that she has not received any counseling regarding  the significance of the cystic hygroma.  She had a cell free DNA test which indicated a low risk for  trisomy 21, 18, and 13.  A female fetus is predicted.  On today's exam, a large cystic hygroma along with fetal  hydrops was noted.   The fetal growth and amniotic fluid level  appeared appropriate for her gestational age.  The patient was advised that a cystic hygroma may be seen  in fetuses with a chromosomal abnormality or a congenital  heart defect.  It can also be seen in fetuses without a  chromosomal abnormality.  She was advised that some cystic  hygromas that are noted in the first trimester may resolve in  the second trimester.  The prognosis for a successful  pregnancy if a cystic hygroma is seen with fetal hydrops in  the second trimester is usually poor.  As fetal hydrops was noted today along with the large cystic  hygroma, the patient was advised that she will most likely  have a fetal demise within the next few weeks.  Due to the increased risk of a chromosomal abnormality with  a cystic hygroma, the patient was advised that an  amniocentesis is recommended for definitive diagnosis of  fetal aneuploidy.  After a complete discussion of the risks  (including a miscarriage rate of 1 in 300) and benefits of the  procedure, the patient consented to have the amniocentesis  performed today.  An uncomplicated genetic amniocentesis was performed  today obtaining 30 cc of straw-colored amniotic fluid which  was sent off for amniotic fluid AFP, FISH, and chromosome  analysis with reflex MicroArray testing.  The patient was  advised that our genetic counselor will notify her regarding  the results of the amniocentesis.  Post amniocentesis  instructions were discussed.  As the patient's blood type is  Rh positive, a dose of RhoGam was not given following the  procedure.  The option for termination of pregnancy due to the poor  prognosis was also discussed today.  The patient was  advised that she should let us know sometime next week  regarding her decision.  The patient will await the [facility]  results before making a final decision.  Should the patient continue with her pregnancy, we will  continue to follow her with serial ultrasounds.  A fetal   echocardiogram will also be ordered later in her pregnancy.  A total of 30 minutes was spent counseling and coordinating  the care for this patient.  Greater than 50% of the time was  spent in direct  face-to-face contact. ----------------------------------------------------------------------                   Ma Rings, MD Electronically Signed Final Report   08/10/2019 05:00 pm ----------------------------------------------------------------------  [place] Mfm Ob Follow Up  Result Date: 08/24/2019 ----------------------------------------------------------------------  OBSTETRICS REPORT                    (Corrected Final 08/24/2019 03:01 pm) ---------------------------------------------------------------------- Patient Info  ID #:       [id number]                          D.O.B.:  1981/02/02 (38 yrs)  Name:       FAMA MUENCHOW Haglund               Visit Date: 08/24/2019 11:31 am ---------------------------------------------------------------------- Performed By  Performed By:     Eden Lathe BS      Ref. Address:     [address]                    [address]                                                             [address]  Attending:        Ma Rings MD         [facility]  Referred By:      [facility] ---------------------------------------------------------------------- Orders   #  Description                          Code         Ordered By   1  [place] MFM OB FOLLOW UP                  95284.13     Rosana Hoes  ----------------------------------------------------------------------   #  Order #                    Accession #                 Episode #   1  [id number]                  [id number]                  [id number]  ---------------------------------------------------------------------- Indications   Antenatal follow-up for nonvisualized fetal    Z36.2   anatomy   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 Cord Insertion:      Marginal insertion prev seen  Amniotic Fluid  AFI FV:      Subjectively low-normal                              Largest Pocket(cm)                              2.69 ---------------------------------------------------------------------- Biometry  BPD:      43.6  mm     G. Age:  19w 1d         14  %    CI:        91.33   %    70 - 86                                                          FL/HC:      15.8   %    16.8 - 19.8  HC:      143.4  mm     G. Age:  17w 4d        < 1  %    HC/AC:      1.18        1.09 - 1.39  AC:      121.2  mm     G. Age:  17w 6d          1  %    FL/BPD:     51.8   %  FL:       22.6  mm     G. Age:  16w 5d        < 1  %    FL/AC:      18.6   %    20 - 24  HUM:      22.8  mm     G. Age:  17w 0d        < 5  %  CER:      20.9  mm     G. Age:  19w 6d         45  %  Est. FW:     193  gm      0 lb 7 oz    < 1  % ---------------------------------------------------------------------- OB History  Gravidity:    3         Term:   0        Prem:   0        SAB:   1  TOP:          1       Ectopic:  0  Living: 0 ---------------------------------------------------------------------- Gestational Age  U/S Today:     17w 6d                                        EDD:   01/26/20  Best:          20w 1d     Det. ByMarcella Dubs         EDD:   01/10/20                                      (06/19/19) ---------------------------------------------------------------------- Anatomy   Cranium:               Skin edema             LVOT:                   Abnormal, see                                                                        comments  Cavum:                 Not well visualized    Aortic Arch:            Abnormal, see                                                                        comments  Ventricles:            Appears normal         Ductal Arch:            Abnormal, see                                                                        comments  Choroid Plexus:        Appears normal         Diaphragm:              Appears normal  Cerebellum:            Visualized             Stomach:                Appears normal, left  sided  Posterior Fossa:       Not well visualized    Abdomen:                Echogenic Bowel  Nuchal Fold:           Cystic hygroma         Abdominal Wall:         Not well visualized  Face:                  Appears normal         Cord Vessels:           2 vessel cord,                         (orbits and profile)                                                                        absent right umb                                                                        art  Lips:                  Not well visualized    Kidneys:                Visualized  Palate:                Appears normal         Bladder:                Previously seen;                                                                        not visualized today  Thoracic:              Appears normal         Spine:                  Previously seen  Heart:                 Abnormal, see          Upper Extremities:      Visualized                         comments  RVOT:                  Abnormal, see          Lower Extremities:      Visualized  comments  Other:  Nasal bone visualized. Hands and feet visualized. Technically difficult          due to fetal position.  ---------------------------------------------------------------------- Cervix Uterus Adnexa  Cervix  Length:            3.4  cm.  Normal appearance by transabdominal scan.  Uterus  No abnormality visualized.  Left Ovary  Not visualized.  Right Ovary  Not visualized.  Cul De Sac  No free fluid seen.  Adnexa  No abnormality visualized. ---------------------------------------------------------------------- Comments  This patient was seen for a follow-up exam due to a fetus with  hydrops and cystic hygroma.  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  The  patient has received extensive counseling with our genetic  counselor regarding Turner syndrome.  She denies any  problems since her last exam.  On today's exam, the overall EFW measures at less than the  1st percentile for her gestational age.  There was normal  amniotic fluid noted.  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  Fetal  hydrops with skin edema, pericardial and pleural effusions,  along with abdominal ascites continues to be noted.  The  fetal hydrops appears worse than compared to her last exam.  A possible fetal heart defect was also noted today.  The poor prognosis for her pregnancy due to the large cystic  hygroma and the worsening of fetal hydrops was discussed  with the patient and her husband.  They were advised that I  anticipate that a fetal demise will most likely occur later in her  pregnancy.  Management options for her pregnancy including termination  of pregnancy versus continued expectant management were  discussed again today.  After a complete discussion, the  patient and her husband stated that due to the poor  prognosis, they would like to proceed with a termination of  pregnancy via a Cytotec induction.  Our genetic counselor will  help with the patient arrange for the termination procedure as  soon as possible.  I advised the patient that I support their  decision to proceed with  termination.  They were reassured that the risk of recurrence of Turner  syndrome with a cystic hygroma is quite low.  I reassured  them that I anticipate that she will most likely have a normal  pregnancy in the future.  A possible succenturiate lobe of the placenta was noted  today.  Care should be taken to ensure that all pieces of the  placenta are delivered at the time of induction. ----------------------------------------------------------------------                        Ma Rings, MD Electronically Signed Corrected Final Report  08/24/2019 03:01 pm ----------------------------------------------------------------------   Assessment and Plan:  Pregnancy: G3P0020 at [redacted]w[redacted]d 1. 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 Obesity affecting pregnancy, antepartum   3. Supervision of high risk pregnancy, antepartum See above  4. AMA (advanced maternal age) multigravida 35+, second trimester See above  Preterm labor symptoms and general obstetric precautions including but not limited to vaginal bleeding, contractions, leaking of fluid and fetal movement were reviewed in detail with the patient. I discussed the assessment and treatment plan with the patient. The patient was provided an opportunity to ask questions and all were answered. The patient agreed with the plan and demonstrated an understanding of the  instructions. The patient was advised to call back or seek an in-person office evaluation/go to [facility] for any urgent or concerning symptoms. Please refer to After Visit Summary for other counseling recommendations.   I provided 8 minutes of face-to-face time during this encounter.  No follow-ups on file.  Future Appointments  Date Time Provider Department Center  08/30/2019  8:45 AM [facility] SCHED ROOM [facility] None  09/05/2019  2:00 PM Federico Flake, MD [facility]    Hermina Staggers, MD Center for [facility], [facility]

## 2019-08-28 NOTE — Progress Notes (Signed)
I connected with  Codi [facility] on 08/28/19 at 10:15 AM EST by telephone and verified that I am speaking with the correct person using two identifiers.   I discussed the limitations, risks, security and privacy concerns of performing an evaluation and management service by telephone and the availability of in person appointments. I also discussed with the patient that there may be a patient responsible charge related to this service. The patient expressed understanding and agreed to proceed.  Crosby Oyster, RN 08/28/2019  10:29 AM

## 2019-08-29 ENCOUNTER — Other Ambulatory Visit: Payer: Self-pay | Admitting: Advanced Practice Midwife

## 2019-08-30 ENCOUNTER — Inpatient Hospital Stay (HOSPITAL_COMMUNITY)
Admission: AD | Admit: 2019-08-30 | Discharge: 2019-09-01 | DRG: 797 | Disposition: A | Discharge status: Home / Self Care | Payer: No Typology Code available for payment source | Attending: Obstetrics & Gynecology | Admitting: Obstetrics & Gynecology

## 2019-08-30 ENCOUNTER — Other Ambulatory Visit: Payer: Self-pay

## 2019-08-30 ENCOUNTER — Encounter (HOSPITAL_COMMUNITY): Payer: Self-pay | Admitting: Obstetrics & Gynecology

## 2019-08-30 ENCOUNTER — Inpatient Hospital Stay (HOSPITAL_COMMUNITY): Payer: No Typology Code available for payment source

## 2019-08-30 DIAGNOSIS — Z3A21 21 weeks gestation of pregnancy: Secondary | ICD-10-CM | POA: Diagnosis not present

## 2019-08-30 DIAGNOSIS — Q969 Turner's syndrome, unspecified: Secondary | ICD-10-CM

## 2019-08-30 DIAGNOSIS — O351XX0 Maternal care for (suspected) chromosomal abnormality in fetus, not applicable or unspecified: Secondary | ICD-10-CM | POA: Diagnosis not present

## 2019-08-30 DIAGNOSIS — O09522 Supervision of elderly multigravida, second trimester: Secondary | ICD-10-CM | POA: Diagnosis present

## 2019-08-30 DIAGNOSIS — O3622X0 Maternal care for hydrops fetalis, second trimester, not applicable or unspecified: Secondary | ICD-10-CM | POA: Diagnosis not present

## 2019-08-30 DIAGNOSIS — O99344 Other mental disorders complicating childbirth: Secondary | ICD-10-CM | POA: Diagnosis present

## 2019-08-30 DIAGNOSIS — F329 Major depressive disorder, single episode, unspecified: Secondary | ICD-10-CM | POA: Diagnosis present

## 2019-08-30 DIAGNOSIS — F419 Anxiety disorder, unspecified: Secondary | ICD-10-CM | POA: Diagnosis present

## 2019-08-30 DIAGNOSIS — E669 Obesity, unspecified: Secondary | ICD-10-CM | POA: Diagnosis present

## 2019-08-30 DIAGNOSIS — Z8759 Personal history of other complications of pregnancy, childbirth and the puerperium: Secondary | ICD-10-CM | POA: Diagnosis not present

## 2019-08-30 DIAGNOSIS — O0990 Supervision of high risk pregnancy, unspecified, unspecified trimester: Secondary | ICD-10-CM

## 2019-08-30 DIAGNOSIS — O328XX0 Maternal care for other malpresentation of fetus, not applicable or unspecified: Secondary | ICD-10-CM | POA: Diagnosis present

## 2019-08-30 DIAGNOSIS — O358XX0 Maternal care for other (suspected) fetal abnormality and damage, not applicable or unspecified: Principal | ICD-10-CM | POA: Diagnosis present

## 2019-08-30 DIAGNOSIS — O99214 Obesity complicating childbirth: Secondary | ICD-10-CM | POA: Diagnosis present

## 2019-08-30 DIAGNOSIS — F32A Depression, unspecified: Secondary | ICD-10-CM | POA: Diagnosis present

## 2019-08-30 DIAGNOSIS — F1211 Cannabis abuse, in remission: Secondary | ICD-10-CM | POA: Diagnosis present

## 2019-08-30 DIAGNOSIS — O99210 Obesity complicating pregnancy, unspecified trimester: Secondary | ICD-10-CM

## 2019-08-30 DIAGNOSIS — O99891 Other specified diseases and conditions complicating pregnancy: Secondary | ICD-10-CM

## 2019-08-30 DIAGNOSIS — O99324 Drug use complicating childbirth: Secondary | ICD-10-CM | POA: Diagnosis present

## 2019-08-30 DIAGNOSIS — Z20828 Contact with and (suspected) exposure to other viral communicable diseases: Secondary | ICD-10-CM | POA: Diagnosis present

## 2019-08-30 LAB — CBC
HCT: 36.3 % (ref 36.0–46.0)
Hemoglobin: 12.4 g/dL (ref 12.0–15.0)
MCH: 30.3 pg (ref 26.0–34.0)
MCHC: 34.2 g/dL (ref 30.0–36.0)
MCV: 88.8 fL (ref 80.0–100.0)
Platelets: 318 10*3/uL (ref 150–400)
RBC: 4.09 MIL/uL (ref 3.87–5.11)
RDW: 12.8 % (ref 11.5–15.5)
WBC: 9.7 10*3/uL (ref 4.0–10.5)
nRBC: 0 % (ref 0.0–0.2)

## 2019-08-30 LAB — TYPE AND SCREEN
ABO/RH(D): A POS
Antibody Screen: NEGATIVE

## 2019-08-30 LAB — BASIC METABOLIC PANEL
Anion gap: 11 (ref 5–15)
BUN: <5 mg/dL — ABNORMAL LOW (ref 6–20)
CO2: 20 mmol/L — ABNORMAL LOW (ref 22–32)
Calcium: 8.6 mg/dL — ABNORMAL LOW (ref 8.9–10.3)
Chloride: 107 mmol/L (ref 98–111)
Creatinine, Ser: 0.46 mg/dL (ref 0.44–1.00)
GFR calc Af Amer: >60 mL/min (ref >60)
GFR calc non Af Amer: >60 mL/min (ref >60)
Glucose, Bld: 123 mg/dL — ABNORMAL HIGH (ref 70–99)
Potassium: 3.1 mmol/L — ABNORMAL LOW (ref 3.5–5.1)
Sodium: 138 mmol/L (ref 135–145)

## 2019-08-30 LAB — ABO/RH: ABO/RH(D): A POS

## 2019-08-30 MED ORDER — HYDROXYZINE HCL 50 MG PO TABS: 50.0000 mg | ORAL_TABLET | Freq: Four times a day (QID) | ORAL | Status: DC | PRN

## 2019-08-30 MED ORDER — LACTATED RINGERS IV SOLN
INTRAVENOUS | Status: DC
  Administered 2019-08-30 – 2019-08-31 (×4): via INTRAVENOUS

## 2019-08-30 MED ORDER — FENTANYL CITRATE (PF) 100 MCG/2ML IJ SOLN
50.0000 ug | INTRAMUSCULAR | Status: DC | PRN
  Administered 2019-08-30: 100 ug via INTRAVENOUS
  Filled 2019-08-30: qty 2

## 2019-08-30 MED ORDER — ACETAMINOPHEN 325 MG PO TABS: 650.0000 mg | ORAL_TABLET | ORAL | Status: DC | PRN

## 2019-08-30 MED ORDER — ACETAMINOPHEN 500 MG PO TABS
1000.0000 mg | ORAL_TABLET | ORAL | Status: DC | PRN
  Administered 2019-08-30: 1000 mg via ORAL
  Filled 2019-08-30: qty 2

## 2019-08-30 MED ORDER — ONDANSETRON HCL 4 MG/2ML IJ SOLN
4.0000 mg | Freq: Four times a day (QID) | INTRAMUSCULAR | Status: DC | PRN
  Administered 2019-08-30 – 2019-08-31 (×2): 4 mg via INTRAVENOUS
  Filled 2019-08-30 (×2): qty 2

## 2019-08-30 MED ORDER — MISOPROSTOL 200 MCG PO TABS
800.0000 ug | ORAL_TABLET | Freq: Once | ORAL | Status: AC
  Administered 2019-08-30: 800 ug via VAGINAL
  Filled 2019-08-30: qty 4

## 2019-08-30 MED ORDER — FLEET ENEMA 7-19 GM/118ML RE ENEM: 1.0000 | ENEMA | Freq: Every day | RECTAL | Status: DC | PRN

## 2019-08-30 MED ORDER — OXYCODONE-ACETAMINOPHEN 5-325 MG PO TABS: 2.0000 | ORAL_TABLET | ORAL | Status: DC | PRN

## 2019-08-30 MED ORDER — OXYCODONE-ACETAMINOPHEN 5-325 MG PO TABS: 1.0000 | ORAL_TABLET | ORAL | Status: DC | PRN

## 2019-08-30 MED ORDER — FENTANYL CITRATE (PF) 100 MCG/2ML IJ SOLN
100.0000 ug | INTRAMUSCULAR | Status: DC | PRN
  Administered 2019-08-30 (×2): 100 ug via INTRAVENOUS
  Filled 2019-08-30 (×2): qty 2

## 2019-08-30 MED ORDER — LACTATED RINGERS IV SOLN: 500.0000 mL | INTRAVENOUS | Status: DC | PRN

## 2019-08-30 MED ORDER — SOD CITRATE-CITRIC ACID 500-334 MG/5ML PO SOLN: 30.0000 mL | ORAL | Status: DC | PRN

## 2019-08-30 MED ORDER — BUTORPHANOL TARTRATE 1 MG/ML IJ SOLN
2.0000 mg | INTRAMUSCULAR | Status: DC | PRN
  Administered 2019-08-30: 2 mg via INTRAVENOUS

## 2019-08-30 MED ORDER — MISOPROSTOL 200 MCG PO TABS
400.0000 ug | ORAL_TABLET | ORAL | Status: DC
  Administered 2019-08-30: 400 ug via ORAL

## 2019-08-30 MED ORDER — MISOPROSTOL 200 MCG PO TABS
600.0000 ug | ORAL_TABLET | ORAL | Status: DC
  Administered 2019-08-30 – 2019-08-31 (×3): 600 ug via ORAL
  Filled 2019-08-30 (×3): qty 3

## 2019-08-30 MED ORDER — BUTORPHANOL TARTRATE 1 MG/ML IJ SOLN
INTRAMUSCULAR | Status: AC
  Filled 2019-08-30: qty 2

## 2019-08-30 MED ORDER — VENLAFAXINE HCL ER 150 MG PO CP24
150.0000 mg | ORAL_CAPSULE | Freq: Every day | ORAL | Status: DC
  Administered 2019-08-30: 150 mg via ORAL
  Filled 2019-08-30: qty 1

## 2019-08-30 MED ORDER — ZOLPIDEM TARTRATE 5 MG PO TABS
5.0000 mg | ORAL_TABLET | Freq: Once | ORAL | Status: AC
  Administered 2019-08-30: 5 mg via ORAL
  Filled 2019-08-30: qty 1

## 2019-08-30 MED ORDER — LIDOCAINE HCL (PF) 1 % IJ SOLN
30.0000 mL | INTRAMUSCULAR | Status: AC | PRN
  Administered 2019-08-31: 11 mL via SUBCUTANEOUS

## 2019-08-30 MED ORDER — MISOPROSTOL 200 MCG PO TABS
400.0000 ug | ORAL_TABLET | ORAL | Status: DC
  Filled 2019-08-30: qty 2

## 2019-08-30 NOTE — Progress Notes (Signed)
LABOR PROGRESS NOTE  Kelly Lopez is a 38 y.o. G3P0020 at [redacted]w[redacted]d  admitted for IOL for fetal anomolies.  Subjective: Feeling some pressure  Objective: BP 124/74   Pulse (!) 113   Temp 99.1 F (37.3 C) (Oral)   Resp 16   Ht 5\' 3"  (1.6 m)   Wt 91.2 kg   BMI 35.61 kg/m  or  Vitals:   08/30/19 1847 08/30/19 1930 08/30/19 2012 08/30/19 2139  BP: (!) 146/87  123/73 124/74  Pulse: (!) 102  (!) 118 (!) 113  Resp:   18 16  Temp:  99.1 F (37.3 C)    TempSrc:  Oral    Weight:      Height:         Dilation: Closed Effacement (%): 50 Cervical Position: Middle Station: Ballotable Presentation: Pilar Plate Breech Exam by:: Kelly Herald RN   Labs: Lab Results  Component Value Date   WBC 9.7 08/30/2019   HGB 12.4 08/30/2019   HCT 36.3 08/30/2019   MCV 88.8 08/30/2019   PLT 318 08/30/2019    Patient Active Problem List   Diagnosis Date Noted  . 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 Fetal hydrops 08/30/2019  . 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 Obesity affecting pregnancy, antepartum 07/11/2019  . Anxiety and depression 07/11/2019  . AMA (advanced maternal age) multigravida 33+, second trimester 06/19/2019  . History of miscarriage, currently pregnant, first trimester 06/19/2019  . Subchorionic hemorrhage of placenta in first trimester 06/19/2019  . Supervision of high risk pregnancy, antepartum 06/19/2019  . 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 G3P0020 at [redacted]w[redacted]d here for IOL for fetal anomalies..  Labor: cont miso 651mcg buccal q4h, due for another dose. FB placed at 1740 still in place on traction.  Pain Control:  Would like to try [place], MD/[facility] OB Fellow  08/30/2019, 11:09 PM

## 2019-08-30 NOTE — Progress Notes (Signed)
I checked in with Kelly Lopez and Kelly Lopez again.  They did not wish to talk further at this time, but are aware of our ongoing availability for support.  [place], [place] Pager, [phone] 4:10 PM

## 2019-08-30 NOTE — H&P (Signed)
Obstetric History and Physical  Kelly Lopez is a 38 y.o. G3P0020 with IUP at [redacted]w[redacted]d presenting for induction of labor due to Turner syndrome with associated cystic hygroma, fetal hydrops, multiple anomalies.  She was told about the associated poor prognosis and desires termination of pregnancy. She was counseled about this at the [facility], and 72 hour [place] papers have been signed on 08/24/19.  She is accompanied by her husband.  Patient states she has been having no contractions, no vaginal bleeding, no leaking of fluid, but some perceived fetal movement.  They are appropriately sad, but have no concerns.  Prenatal Course Source of Care: [facility] Pregnancy complications or risks: Patient Active Problem List   Diagnosis Date Noted  . 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 Obesity affecting pregnancy, antepartum 07/11/2019  . Anxiety and depression 07/11/2019  . AMA (advanced maternal age) multigravida 35+, second trimester 06/19/2019  . History of miscarriage, currently pregnant, first trimester 06/19/2019  . Subchorionic hemorrhage of placenta in first trimester 06/19/2019  . Supervision of high risk pregnancy, antepartum 06/19/2019  . 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 Anxiety   . Depression   . Esophageal dysphagia   . Psoriasis     Past Surgical History:  Procedure Laterality Date  . NO PAST SURGERIES      OB History  Gravida Para Term Preterm AB Living  3       2 0  SAB TAB Ectopic Multiple Live Births  1 1          # Outcome Date GA Lbr Len/2nd Weight Sex Delivery Anes PTL Lv  3 Current           2 SAB 2020          1 TAB 2000            Social History   Socioeconomic History  . Marital status: Significant Other    Spouse name: Not on file  . Number of children: Not on file  . Years of  education: Not on file  . Highest education level: Not on file  Occupational History  . Not on file  Tobacco Use  . Smoking status: Never Smoker  . Smokeless tobacco: Never Used  Substance and Sexual Activity  . Alcohol use: Not Currently  . Drug use: Not Currently  . Sexual activity: Not Currently    Birth control/protection: None  Other Topics Concern  . Not on file  Social History Narrative  . Not on file   Social Determinants of Health   Financial Resource Strain:   . Difficulty of Paying Living Expenses: Not on file  Food Insecurity:   . Worried About Programme researcher, broadcasting/film/video in the Last Year: Not on file  . Ran Out of Food in the Last Year: Not on file  Transportation Needs:   . Lack of Transportation (Medical): Not on file  . Lack of Transportation (Non-Medical): Not on file  Physical Activity:   . Days of Exercise per Week: Not on file  . Minutes of Exercise per Session: Not on file  Stress:   . Feeling of Stress : Not on file  Social Connections:   . Frequency of Communication with Friends and Family: Not on file  . Frequency of Social Gatherings with Friends and Family: Not on file  .  Attends Religious Services: Not on file  . Active Member of Clubs or Organizations: Not on file  . Attends Banker Meetings: Not on file  . Marital Status: Not on file    History reviewed. No pertinent family history.  Medications Prior to Admission  Medication Sig Dispense Refill Last Dose  . docusate sodium (COLACE) 100 MG capsule Take 100 mg by mouth 2 (two) times daily.     Marland Kitchen omeprazole (PRILOSEC) 20 MG capsule Take 20 mg by mouth daily.     . ondansetron (ZOFRAN ODT) 4 MG disintegrating tablet Take 1 tablet (4 mg total) by mouth every 8 (eight) hours as needed for nausea or vomiting. Allow 1-2 tablets to dissolve in your mouth every 8 hours as needed for nausea/vomiting 30 tablet 1   . Prenatal Vit-Fe Fumarate-FA (MULTIVITAMIN-PRENATAL) 27-0.8 MG TABS tablet Take 1  tablet by mouth daily at 12 noon.     . venlafaxine XR (EFFEXOR-XR) 150 MG 24 hr capsule Take 150 mg by mouth daily with breakfast.       Allergies  Allergen Reactions  . Sertraline Hcl Other (See Comments)    Review of Systems: Negative except for what is mentioned in HPI.  Physical Exam: BP 132/78   Pulse 96   Temp 98.2 F (36.8 C) (Oral)   Resp 20   Ht 5\' 3"  (1.6 m)   Wt 91.2 kg   BMI 35.61 kg/m  CONSTITUTIONAL: Well-developed, well-nourished female in no acute distress.  HENT:  Normocephalic, atraumatic, External right and left ear normal. Oropharynx is clear and moist EYES: Conjunctivae and EOM are normal. Pupils are equal, round, and reactive to light. No scleral icterus.  NECK: Normal range of motion, supple, no masses SKIN: Skin is warm and dry. No rash noted. Not diaphoretic. No erythema. No pallor. NEUROLOGIC: Alert and oriented to person, place, and time. Normal reflexes, muscle tone coordination. No cranial nerve deficit noted. PSYCHIATRIC: Normal mood and affect. Normal behavior. Normal judgment and thought content. CARDIOVASCULAR: Normal heart rate noted, regular rhythm RESPIRATORY: Effort and breath sounds normal, no problems with respiration noted ABDOMEN: Soft, nontender, nondistended, gravid. MUSCULOSKELETAL: Normal range of motion. No edema and no tenderness. 2+ distal pulses.    Pertinent Labs/Studies:   Results for orders placed or performed during the hospital encounter of 08/28/19 (from the past 72 hour(s))  SARS CORONAVIRUS 2 (TAT 6-24 HRS) Nasopharyngeal Nasopharyngeal Swab     Status: None   Collection Time: 08/28/19  8:52 AM   Specimen: Nasopharyngeal Swab  Result Value Ref Range   SARS Coronavirus 2 NEGATIVE NEGATIVE    Comment: (NOTE) SARS-CoV-2 target nucleic acids are NOT DETECTED. The SARS-CoV-2 RNA is generally detectable in upper and lower respiratory specimens during the acute phase of infection. Negative results do not preclude  SARS-CoV-2 infection, do not rule out co-infections with other pathogens, and should not be used as the sole basis for treatment or other patient management decisions. Negative results must be combined with clinical observations, patient history, and epidemiological information. The expected result is Negative. Fact Sheet for Patients: HairSlick.no Fact Sheet for Healthcare Providers: quierodirigir.com This test is not yet approved or cleared by the [place] FDA and  has been authorized for detection and/or diagnosis of SARS-CoV-2 by FDA under an Emergency Use Authorization (EUA). This EUA will remain  in effect (meaning this test can be used) for the duration of the COVID-19 declaration under Section 56 4(b)(1) of the Act, 21 U.S.C. section 360bbb-3(b)(1), unless  the authorization is terminated or revoked sooner. Performed at [facility], [address] [place], [place] [address]     [place] MFM AMNIOCENTESIS  Result Date: 08/10/2019 ----------------------------------------------------------------------  OBSTETRICS REPORT                       (Signed Final 08/10/2019 05:00 pm) ---------------------------------------------------------------------- Patient Info  ID #:       [id number]                          D.O.B.:  [birth year] (38 yrs)  Name:       Kelly Lopez               Visit Date: 08/10/2019 01:26 pm ---------------------------------------------------------------------- Performed By  Performed By:     Marcellina Millin          Ref. Address:     [address]                    [place]                                                             [address]  Attending:        Ma Rings MD         [facility]  Referred By:      [facility] ---------------------------------------------------------------------- Orders   #  Description                           Code         Ordered By   1  [place] MFM AMNIOCENTESIS                 [phone]     Lyndel Safe   2  [place] MFM OB DETAIL +14 WK              76811.01     [facility] [place]  ----------------------------------------------------------------------   #  Order #                    Accession #                 Episode #   1  [id number]                  [id number]   2  [id number]                  [id number]                  [id number]  ---------------------------------------------------------------------- Indications   Cystic hygroma  O35.30XX9   Advanced maternal age multigravida 80+,        O65.522   second trimester   [redacted] weeks gestation of pregnancy                Z3A.18   Encounter for antenatal screening for          Z36.3   malformations (low risk)  ---------------------------------------------------------------------- Fetal Evaluation  Num Of Fetuses:         1  Fetal Heart Rate(bpm):  150  Cardiac Activity:       Observed  Presentation:           Breech  Placenta:               Posterior Previa, succenturiate lobe  P. Cord Insertion:      Marginal insertion  Amniotic Fluid  AFI FV:      Subjectively low-normal                              Largest Pocket(cm)                              2.98 ---------------------------------------------------------------------- Biometry  BPD:      39.7  mm     G. Age:  18w 0d         47  %    CI:        89.59   %    70 - 86                                                          FL/HC:      14.1   %    15.8 - 18  HC:      131.9  mm     G. Age:  16w 5d          2  %    HC/AC:      0.85        1.07 - 1.29  AC:      155.5  mm     G. Age:  20w 5d         99  %    FL/BPD:     46.9   %  FL:       18.6  mm     G. Age:  15w 4d        < 1  %    FL/AC:      12.0   %    20 - 24  HUM:      19.5  mm     G. Age:  15w 5d        < 5  %  Est. FW:     225  gm      0 lb 8 oz     44  %  ---------------------------------------------------------------------- OB History  Gravidity:    3         Term:   0        Prem:   0        SAB:   1  TOP:          1  Ectopic:  0        Living: 0 ---------------------------------------------------------------------- Gestational Age  U/S Today:     17w 5d                                        EDD:   01/13/20  Best:          18w 1d     Det. ByMarcella Dubs         EDD:   01/10/20                                      (06/19/19) ---------------------------------------------------------------------- Anatomy  Cranium:               Skin edema             Aortic Arch:            Not well visualized  Cavum:                 Not well visualized    Ductal Arch:            Not well visualized  Ventricles:            Not well visualized    Diaphragm:              Not well visualized  Choroid Plexus:        Appears normal         Stomach:                Appears normal, left                                                                        sided  Cerebellum:            Not well visualized    Abdomen:                Abnormal, see                                                                        comments  Posterior Fossa:       Not well visualized    Abdominal Wall:         Not well visualized  Nuchal Fold:           Cystic hygroma         Cord Vessels:           Not well visualized  Face:                  Not well visualized    Kidneys:                Not well visualized  Lips:  Not well visualized    Bladder:                Appears normal  Thoracic:              Appears normal         Spine:                  Appears normal  Heart:                 Not well visualized    Upper Extremities:      Visualized  RVOT:                  Not well visualized    Lower Extremities:      Visualized  LVOT:                  Not well visualized  Other:  Technically difficult due to fetal position.  ---------------------------------------------------------------------- Guided Procedures  Type:   Amniocentesis  FH Post Procedure:     Normal             RH Type:          A+  Rh Immune Globulin:    Not required,      Discharge Inst.:  Post-procedure                         Rh positive                          instructions                                                              given  Needle Insertions:     20 gauge x 1       Vol. Withdrawn:   30 ml of clear                                                              amniotic fluid  Transabdominal:        Yes  Complications:  None  Comment:                    Informed consent was obtained. A "time-out" was performed before the procedure. Patient                              tolerated the procedure well. We gave her post-procedure instructions. ---------------------------------------------------------------------- Cervix Uterus Adnexa  Cervix  Length:            4.2  cm.  Normal appearance by transabdominal scan. ---------------------------------------------------------------------- Comments  This patient was seen for a detailed ultrasound as a cystic  hygroma was noted during her first trimester ultrasound  performed at [facility].  The patient reports that this is  her third pregnancy.  She had a prior elective termination of  pregnancy and a first trimester miscarriage with her prior 2  pregnancies.  She denies any significant past medical history  and denies any problems in her current pregnancy.  She  reports that she has not received any counseling regarding  the significance of the cystic hygroma.  She had a cell free DNA test which indicated a low risk for  trisomy 21, 18, and 13.  A female fetus is predicted.  On today's exam, a large cystic hygroma along with fetal  hydrops was noted.  The fetal growth and amniotic fluid level  appeared appropriate for her gestational age.  The patient was advised that a cystic hygroma may be seen  in fetuses with  a chromosomal abnormality or a congenital  heart defect.  It can also be seen in fetuses without a  chromosomal abnormality.  She was advised that some cystic  hygromas that are noted in the first trimester may resolve in  the second trimester.  The prognosis for a successful  pregnancy if a cystic hygroma is seen with fetal hydrops in  the second trimester is usually poor.  As fetal hydrops was noted today along with the large cystic  hygroma, the patient was advised that she will most likely  have a fetal demise within the next few weeks.  Due to the increased risk of a chromosomal abnormality with  a cystic hygroma, the patient was advised that an  amniocentesis is recommended for definitive diagnosis of  fetal aneuploidy.  After a complete discussion of the risks  (including a miscarriage rate of 1 in 300) and benefits of the  procedure, the patient consented to have the amniocentesis  performed today.  An uncomplicated genetic amniocentesis was performed  today obtaining 30 cc of straw-colored amniotic fluid which  was sent off for amniotic fluid AFP, FISH, and chromosome  analysis with reflex MicroArray testing.  The patient was  advised that our genetic counselor will notify her regarding  the results of the amniocentesis.  Post amniocentesis  instructions were discussed.  As the patient's blood type is  Rh positive, a dose of RhoGam was not given following the  procedure.  The option for termination of pregnancy due to the poor  prognosis was also discussed today.  The patient was  advised that she should let us know sometime next week  regarding her decision.  The patient will await the [facility]  results before making a final decision.  Should the patient continue with her pregnancy, we will  continue to follow her with serial ultrasounds.  A fetal  echocardiogram will also be ordered later in her pregnancy.  A total of 30 minutes was spent counseling and coordinating  the care for this patient.  Greater than  50% of the time was  spent in direct face-to-face contact. ----------------------------------------------------------------------                   Ma Rings, MD Electronically Signed Final Report   08/10/2019 05:00 pm ----------------------------------------------------------------------  [place] MFM OB DETAIL +14 WK  Result Date: 08/10/2019 ----------------------------------------------------------------------  OBSTETRICS REPORT                       (Signed Final 08/10/2019 05:00 pm) ---------------------------------------------------------------------- Patient Info  ID #:       [id number]                          D.O.B.:  Oct 06, 1980 (38 yrs)  Name:       Kelly Lopez  Visit Date: 08/10/2019 01:26 pm ---------------------------------------------------------------------- Performed By  Performed By:     Berlinda Last          Ref. Address:     [address]                                                             [address]  Attending:        Johnell Comings MD         [facility]  Referred By:      [facility] ---------------------------------------------------------------------- Orders   #  Description                          Code         Ordered By   1  [place] MFM AMNIOCENTESIS                 [phone]     Lauretta Chester   2  [place] MFM OB DETAIL +14 [place]              [address]11.01     [facility] [place]  ----------------------------------------------------------------------   #  Order #                    Accession #                 Episode #   1  [id number]                  [id number]   2  [id number]                  [id number]                  [id number]  ---------------------------------------------------------------------- Indications   Cystic hygroma                                 O35.36XX9   Advanced maternal age multigravida 37+,        O95.522   second trimester    [redacted] weeks gestation of pregnancy                Z3A.18   Encounter for antenatal screening for          Z36.3   malformations (low risk)  ---------------------------------------------------------------------- Fetal Evaluation  Num Of Fetuses:         1  Fetal Heart Rate(bpm):  150  Cardiac Activity:       Observed  Presentation:  Breech  Placenta:               Posterior Previa, succenturiate lobe  P. Cord Insertion:      Marginal insertion  Amniotic Fluid  AFI FV:      Subjectively low-normal                              Largest Pocket(cm)                              2.98 ---------------------------------------------------------------------- Biometry  BPD:      39.7  mm     G. Age:  18w 0d         47  %    CI:        89.59   %    70 - 86                                                          FL/HC:      14.1   %    15.8 - 18  HC:      131.9  mm     G. Age:  16w 5d          2  %    HC/AC:      0.85        1.07 - 1.29  AC:      155.5  mm     G. Age:  20w 5d         99  %    FL/BPD:     46.9   %  FL:       18.6  mm     G. Age:  15w 4d        < 1  %    FL/AC:      12.0   %    20 - 24  HUM:      19.5  mm     G. Age:  15w 5d        < 5  %  Est. FW:     225  gm      0 lb 8 oz     44  % ---------------------------------------------------------------------- OB History  Gravidity:    3         Term:   0        Prem:   0        SAB:   1  TOP:          1       Ectopic:  0        Living: 0 ---------------------------------------------------------------------- Gestational Age  U/S Today:     17w 5d                                        EDD:   01/13/20  Best:          18w 1d     Det. ByMarcella Dubs:  Early Ultrasound         EDD:   01/10/20                                      (  06/19/19) ---------------------------------------------------------------------- Anatomy  Cranium:               Skin edema             Aortic Arch:            Not well visualized  Cavum:                 Not well visualized    Ductal Arch:            Not  well visualized  Ventricles:            Not well visualized    Diaphragm:              Not well visualized  Choroid Plexus:        Appears normal         Stomach:                Appears normal, left                                                                        sided  Cerebellum:            Not well visualized    Abdomen:                Abnormal, see                                                                        comments  Posterior Fossa:       Not well visualized    Abdominal Wall:         Not well visualized  Nuchal Fold:           Cystic hygroma         Cord Vessels:           Not well visualized  Face:                  Not well visualized    Kidneys:                Not well visualized  Lips:                  Not well visualized    Bladder:                Appears normal  Thoracic:              Appears normal         Spine:                  Appears normal  Heart:                 Not well visualized    Upper Extremities:      Visualized  RVOT:                  Not well visualized  Lower Extremities:      Visualized  LVOT:                  Not well visualized  Other:  Technically difficult due to fetal position. ---------------------------------------------------------------------- Guided Procedures  Type:   Amniocentesis  FH Post Procedure:     Normal             RH Type:          A+  Rh Immune Globulin:    Not required,      Discharge Inst.:  Post-procedure                         Rh positive                          instructions                                                              given  Needle Insertions:     20 gauge x 1       Vol. Withdrawn:   30 ml of clear                                                              amniotic fluid  Transabdominal:        Yes  Complications:  None  Comment:                    Informed consent was obtained. A "time-out" was performed before the procedure. Patient                              tolerated the procedure well. We gave her post-procedure  instructions. ---------------------------------------------------------------------- Cervix Uterus Adnexa  Cervix  Length:            4.2  cm.  Normal appearance by transabdominal scan. ---------------------------------------------------------------------- Comments  This patient was seen for a detailed ultrasound as a cystic  hygroma was noted during her first trimester ultrasound  performed at [facility].  The patient reports that this is  her third pregnancy.  She had a prior elective termination of  pregnancy and a first trimester miscarriage with her prior 2  pregnancies.  She denies any significant past medical history  and denies any problems in her current pregnancy.  She  reports that she has not received any counseling regarding  the significance of the cystic hygroma.  She had a cell free DNA test which indicated a low risk for  trisomy 21, 18, and 13.  A female fetus is predicted.  On today's exam, a large cystic hygroma along with fetal  hydrops was noted.  The fetal growth and amniotic fluid level  appeared appropriate for her gestational age.  The patient was advised that a cystic hygroma may be seen  in fetuses with a chromosomal abnormality or a congenital  heart defect.  It can also be seen in fetuses without  a  chromosomal abnormality.  She was advised that some cystic  hygromas that are noted in the first trimester may resolve in  the second trimester.  The prognosis for a successful  pregnancy if a cystic hygroma is seen with fetal hydrops in  the second trimester is usually poor.  As fetal hydrops was noted today along with the large cystic  hygroma, the patient was advised that she will most likely  have a fetal demise within the next few weeks.  Due to the increased risk of a chromosomal abnormality with  a cystic hygroma, the patient was advised that an  amniocentesis is recommended for definitive diagnosis of  fetal aneuploidy.  After a complete discussion of the risks  (including a  miscarriage rate of 1 in 300) and benefits of the  procedure, the patient consented to have the amniocentesis  performed today.  An uncomplicated genetic amniocentesis was performed  today obtaining 30 cc of straw-colored amniotic fluid which  was sent off for amniotic fluid AFP, FISH, and chromosome  analysis with reflex MicroArray testing.  The patient was  advised that our genetic counselor will notify her regarding  the results of the amniocentesis.  Post amniocentesis  instructions were discussed.  As the patient's blood type is  Rh positive, a dose of RhoGam was not given following the  procedure.  The option for termination of pregnancy due to the poor  prognosis was also discussed today.  The patient was  advised that she should let us know sometime next week  regarding her decision.  The patient will await the [facility]  results before making a final decision.  Should the patient continue with her pregnancy, we will  continue to follow her with serial ultrasounds.  A fetal  echocardiogram will also be ordered later in her pregnancy.  A total of 30 minutes was spent counseling and coordinating  the care for this patient.  Greater than 50% of the time was  spent in direct face-to-face contact. ----------------------------------------------------------------------                   Ma Rings, MD Electronically Signed Final Report   08/10/2019 05:00 pm ----------------------------------------------------------------------  [place] MFM OB FOLLOW UP  Result Date: 08/24/2019 ----------------------------------------------------------------------  OBSTETRICS REPORT                    (Corrected Final 08/24/2019 03:01 pm) ---------------------------------------------------------------------- Patient Info  ID #:       [id number]                          D.O.B.:  1981-03-26 (38 yrs)  Name:       Kelly Lopez               Visit Date: 08/24/2019 11:31 am  ---------------------------------------------------------------------- Performed By  Performed By:     Eden Lathe BS      Ref. Address:     [address]                    [address]                                                             [address]  Attending:        Ma Rings MD         [facility]  Referred By:      [facility] ---------------------------------------------------------------------- Orders   #  Description                          Code         Ordered By   1  [place] MFM OB FOLLOW UP                  04540.98     Rosana Hoes  ----------------------------------------------------------------------   #  Order #                    Accession #                 Episode #   1  [id number]                  [id number]                  [id number]  ---------------------------------------------------------------------- Indications   Antenatal follow-up for nonvisualized fetal    Z36.2   anatomy   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 Cord Insertion:      Marginal insertion prev seen  Amniotic Fluid  AFI FV:      Subjectively low-normal                              Largest Pocket(cm)                              2.69 ---------------------------------------------------------------------- Biometry  BPD:      43.6  mm     G. Age:  19w 1d         14  %    CI:        91.33   %    70 - 86  FL/HC:      15.8   %    16.8 - 19.8  HC:      143.4  mm     G. Age:  17w 4d        < 1  %    HC/AC:      1.18        1.09 - 1.39  AC:      121.2  mm     G. Age:  17w 6d          1  %    FL/BPD:     51.8   %  FL:       22.6  mm     G. Age:  16w 5d        < 1  %    FL/AC:      18.6   %    20 - 24  HUM:      22.8  mm     G. Age:  17w 0d        < 5  %  CER:      20.9  mm     G. Age:  19w 6d         45  %  Est. FW:     193  gm      0 lb 7 oz    < 1  % ---------------------------------------------------------------------- OB History  Gravidity:    3         Term:   0        Prem:   0        SAB:   1  TOP:          1       Ectopic:  0        Living: 0 ---------------------------------------------------------------------- Gestational Age  U/S Today:     17w 6d                                        EDD:   01/26/20  Best:          20w 1d     Det. By:  Marcella Dubs         EDD:   01/10/20                                      (06/19/19) ---------------------------------------------------------------------- Anatomy  Cranium:               Skin edema             LVOT:                   Abnormal, see                                                                        comments  Cavum:                 Not well visualized    Aortic Arch:  Abnormal, see                                                                        comments  Ventricles:            Appears normal         Ductal Arch:            Abnormal, see                                                                        comments  Choroid Plexus:        Appears normal         Diaphragm:              Appears normal  Cerebellum:            Visualized             Stomach:                Appears normal, left                                                                        sided  Posterior Fossa:       Not well visualized    Abdomen:                Echogenic Bowel  Nuchal Fold:           Cystic hygroma          Abdominal Wall:         Not well visualized  Face:                  Appears normal         Cord Vessels:           2 vessel cord,                         (orbits and profile)                                                                        absent right umb  art  Lips:                  Not well visualized    Kidneys:                Visualized  Palate:                Appears normal         Bladder:                Previously seen;                                                                        not visualized today  Thoracic:              Appears normal         Spine:                  Previously seen  Heart:                 Abnormal, see          Upper Extremities:      Visualized                         comments  RVOT:                  Abnormal, see          Lower Extremities:      Visualized                         comments  Other:  Nasal bone visualized. Hands and feet visualized. Technically difficult          due to fetal position. ---------------------------------------------------------------------- Cervix Uterus Adnexa  Cervix  Length:            3.4  cm.  Normal appearance by transabdominal scan.  Uterus  No abnormality visualized.  Left Ovary  Not visualized.  Right Ovary  Not visualized.  Cul De Sac  No free fluid seen.  Adnexa  No abnormality visualized. ---------------------------------------------------------------------- Comments  This patient was seen for a follow-up exam due to a fetus with  hydrops and cystic hygroma.  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  The  patient has received extensive counseling with our genetic  counselor regarding Turner syndrome.  She denies any  problems since her last exam.  On today's exam, the overall EFW measures at less than the  1st percentile for her gestational age.  There was normal  amniotic fluid noted.  The large cystic hygroma continues  to be noted today.  Fetal  hydrops with skin edema, pericardial and pleural effusions,  along with abdominal ascites continues to be noted.  The  fetal hydrops appears worse than compared to her last exam.  A possible fetal heart defect was also noted today.  The poor prognosis for her pregnancy due to the large cystic  hygroma and the worsening of fetal hydrops was discussed  with the patient and her husband.  They were advised that I  anticipate that a fetal  demise will most likely occur later in her  pregnancy.  Management options for her pregnancy including termination  of pregnancy versus continued expectant management were  discussed again today.  After a complete discussion, the  patient and her husband stated that due to the poor  prognosis, they would like to proceed with a termination of  pregnancy via a Cytotec induction.  Our genetic counselor will  help with the patient arrange for the termination procedure as  soon as possible.  I advised the patient that I support their  decision to proceed with termination.  They were reassured that the risk of recurrence of Turner  syndrome with a cystic hygroma is quite low.  I reassured  them that I anticipate that she will most likely have a normal  pregnancy in the future.  A possible succenturiate lobe of the placenta was noted  today.  Care should be taken to ensure that all pieces of the  placenta are delivered at the time of induction. ----------------------------------------------------------------------                        Ma Rings, MD Electronically Signed Corrected Final Report  08/24/2019 03:01 pm ----------------------------------------------------------------------   Assessment : Keriann Rankin Vitelli is a 38 y.o. 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   Plan: Misoprostol induction ordered; will start with 800 mcg then 400 mcg every 4  hours. Analgesia as needed. Patient counseled about risk of needing D&E due to infection, hemorrhage, retained placenta or other concerning situation. Patient also understands that fetus may not survive process of induction. If born alive, the period of survival can be minutes to hours; no neonatal resuscitative efforts will be performed. She and her husband agree with this plan. Chaplain consultation made, patient declines at this time.  Support given to patient and her husband.   Jaynie Collins, MD, FACOG Obstetrician & Gynecologist, [facility] for [employer], [facility]

## 2019-08-30 NOTE — Progress Notes (Signed)
I introduced spiritual care services to [place] and Aaron Edelman.  They have good support from family and they are aware of what to expect through the delivery process.  They did not wish to speak at this time, but are open to me checking in with them later.  [place], [place] Pager, [phone] 9:39 AM

## 2019-08-30 NOTE — Progress Notes (Signed)
LABOR AND DELIVERY PROGRESS NOTE  Kelly Lopez is a 38 y.o. G3P0020 at [redacted]w[redacted]d admitted for IOL forTurner syndrome with associated cystic hygroma, fetal hydrops, multiple anomalies.   Subjective: Resting comfortably.  Objective: Vitals:   08/30/19 1846 08/30/19 1847 08/30/19 1930 08/30/19 2012  BP:  (!) 146/87  123/73  Pulse:  (!) 102  (!) 118  Resp: 20   18  Temp: 99.6 F (37.6 C)  99.1 F (37.3 C)   TempSrc: Oral  Oral   Weight:      Height:        SVE:   Dilation: Closed Effacement (%): 50 Station: Ballotable Exam by:: Anjelika Ausburn MD  Foley bulb in place, placed under tension by taping it to her right inner thigh Scant yellow amniotic fluid seen  Labs: Lab Results  Component Value Date   WBC 9.7 08/30/2019   HGB 12.4 08/30/2019   HCT 36.3 08/30/2019   MCV 88.8 08/30/2019   PLT 318 08/30/2019   Assessment / Plan: IOL at [redacted]w[redacted]d forTurner syndrome with associated cystic hygroma, fetal hydrops, multiple anomalies.  - Continue Misoprostol 600 mcg buccal q4h until delivery, hoping it happens soon. - Analgesia as needed - Continue close observation    Verita Schneiders, MD 08/30/2019, 8:39 PM

## 2019-08-30 NOTE — Progress Notes (Signed)
CSW received consult due to termination of pregnancy due to Turner syndrome and multiple anomalies. CSW available for support secondary to [facility] and will await call from [place] before becoming involved.  CSW screening out referral at this time.  Elijio Miles, [place]  [facility] and [employer] [phone]

## 2019-08-30 NOTE — Progress Notes (Signed)
   Kelly Lopez is a 38 y.o. G3P0020 at [redacted]w[redacted]d  admitted for  IOL for Turner syndrome, fetal hydrops, cystic hygroma and possible cardiac defect.  Subjective: Doing well; feeling a little pressure when she moves.  Objective: Vitals:   08/30/19 0915 08/30/19 1053 08/30/19 1311 08/30/19 1512  BP:  132/78 129/79 139/86  Pulse:  96 81 (!) 101  Resp:  20 20 (P) 18  Temp:   97.7 F (36.5 C)   TempSrc:   Oral   Weight: 91.2 kg     Height: 5\' 3"  (1.6 m)      No intake/output data recorded.  Fetal monitoring and toco NA SVE:   Dilation: (P) Closed Effacement (%): (P) 50 Station: -2 Exam by:: (P) Kelly Lopez  Labs: Lab Results  Component Value Date   WBC 9.7 08/30/2019   HGB 12.4 08/30/2019   HCT 36.3 08/30/2019   MCV 88.8 08/30/2019   PLT 318 08/30/2019    Assessment / Plan:  -at 3 pm exam, patient cervix was unchanged, cytotec pills palpated in vagina.  -will give 400 of cyctec buccal -ambien for rest.   Mervyn Skeeters Kelly Lopez 08/30/2019, 3:31 PM

## 2019-08-30 NOTE — Progress Notes (Signed)
LABOR AND DELIVERY PROGRESS NOTE  Kelly Lopez is a 38 y.o. G3P0020 at [redacted]w[redacted]d  admitted for IOL forTurner syndrome with associated cystic hygroma, fetal hydrops, multiple anomalies.   Subjective: Having chills, received buccal misoprostol recently  Objective: Vitals:   08/30/19 1311 08/30/19 1512 08/30/19 1702 08/30/19 1705  BP: 129/79 139/86  (!) 146/65  Pulse: 81 (!) 101  (!) 108  Resp: 20 18 20    Temp: 97.7 F (36.5 C)  99.7 F (37.6 C)   TempSrc: Oral  Oral   Weight:      Height:       No intake/output data recorded.  Fetal monitoring and toco NA SVE:   Dilation: Closed Effacement (%): 50 Station: Ballotable Exam by:: Dr. Harolyn Rutherford.  Intracervical foley bulb placed. Balloon inflated with 60 ml of fluid.  AROM for small amount of yellow amniotic fluid during foley bulb placement Undissolved misoprostol pills seen in upper vagina.  Labs: Lab Results  Component Value Date   WBC 9.7 08/30/2019   HGB 12.4 08/30/2019   HCT 36.3 08/30/2019   MCV 88.8 08/30/2019   PLT 318 08/30/2019   Assessment / Plan: IOL at [redacted]w[redacted]d forTurner syndrome with associated cystic hygroma, fetal hydrops, multiple anomalies.  - s/p AROM and foley bulb placement - Acetaminophen for chills, T 99.7. This is side effect of misoprostol. - Continue Misoprostol 600 mcg buccal q4h until delivery (increased from 400 mcg dose) - Analgesia as needed - Continue close observation    Verita Schneiders, MD 08/30/2019, 5:30 PM

## 2019-08-31 ENCOUNTER — Inpatient Hospital Stay (HOSPITAL_COMMUNITY): Payer: No Typology Code available for payment source | Admitting: Anesthesiology

## 2019-08-31 ENCOUNTER — Encounter (HOSPITAL_COMMUNITY)
Admission: AD | Disposition: A | Discharge status: Home / Self Care | Payer: Self-pay | Source: Home / Self Care | Attending: Obstetrics & Gynecology

## 2019-08-31 ENCOUNTER — Other Ambulatory Visit: Payer: Self-pay | Admitting: *Deleted

## 2019-08-31 ENCOUNTER — Encounter (HOSPITAL_COMMUNITY): Payer: Self-pay | Admitting: Obstetrics & Gynecology

## 2019-08-31 ENCOUNTER — Inpatient Hospital Stay (HOSPITAL_COMMUNITY): Payer: No Typology Code available for payment source

## 2019-08-31 DIAGNOSIS — O328XX0 Maternal care for other malpresentation of fetus, not applicable or unspecified: Secondary | ICD-10-CM

## 2019-08-31 DIAGNOSIS — F329 Major depressive disorder, single episode, unspecified: Secondary | ICD-10-CM

## 2019-08-31 DIAGNOSIS — Z8759 Personal history of other complications of pregnancy, childbirth and the puerperium: Secondary | ICD-10-CM

## 2019-08-31 DIAGNOSIS — Z3A21 21 weeks gestation of pregnancy: Secondary | ICD-10-CM

## 2019-08-31 DIAGNOSIS — O99891 Other specified diseases and conditions complicating pregnancy: Secondary | ICD-10-CM

## 2019-08-31 DIAGNOSIS — O351XX0 Maternal care for (suspected) chromosomal abnormality in fetus, not applicable or unspecified: Secondary | ICD-10-CM

## 2019-08-31 DIAGNOSIS — O3622X0 Maternal care for hydrops fetalis, second trimester, not applicable or unspecified: Secondary | ICD-10-CM

## 2019-08-31 DIAGNOSIS — F32A Depression, unspecified: Secondary | ICD-10-CM

## 2019-08-31 DIAGNOSIS — Q969 Turner's syndrome, unspecified: Secondary | ICD-10-CM

## 2019-08-31 HISTORY — DX: Personal history of other complications of pregnancy, childbirth and the puerperium: Z87.59

## 2019-08-31 HISTORY — PX: DILATION AND EVACUATION: SHX1459

## 2019-08-31 SURGERY — DILATION AND EVACUATION, UTERUS
Anesthesia: Epidural

## 2019-08-31 MED ORDER — SIMETHICONE 80 MG PO CHEW: 80.0000 mg | CHEWABLE_TABLET | ORAL | Status: DC | PRN

## 2019-08-31 MED ORDER — GABAPENTIN 300 MG PO CAPS
300.0000 mg | ORAL_CAPSULE | Freq: Two times a day (BID) | ORAL | Status: DC
  Administered 2019-08-31 (×2): 300 mg via ORAL
  Filled 2019-08-31 (×2): qty 1

## 2019-08-31 MED ORDER — OXYTOCIN 10 UNIT/ML IJ SOLN: 10.0000 [IU] | Freq: Once | INTRAMUSCULAR | Status: DC

## 2019-08-31 MED ORDER — MAGNESIUM HYDROXIDE 400 MG/5ML PO SUSP: 30.0000 mL | ORAL | Status: DC | PRN

## 2019-08-31 MED ORDER — SODIUM CHLORIDE (PF) 0.9 % IJ SOLN
INTRAMUSCULAR | Status: DC | PRN
  Administered 2019-08-31: 12 mL/h via EPIDURAL

## 2019-08-31 MED ORDER — FENTANYL CITRATE (PF) 100 MCG/2ML IJ SOLN: 25.0000 ug | INTRAMUSCULAR | Status: DC | PRN

## 2019-08-31 MED ORDER — OXYCODONE-ACETAMINOPHEN 5-325 MG PO TABS
2.0000 | ORAL_TABLET | ORAL | Status: DC | PRN
  Filled 2019-08-31: qty 2

## 2019-08-31 MED ORDER — EPHEDRINE 5 MG/ML INJ: 10.0000 mg | INTRAVENOUS | Status: DC | PRN

## 2019-08-31 MED ORDER — OXYCODONE HCL 5 MG PO TABS: 5.0000 mg | ORAL_TABLET | ORAL | Status: DC | PRN

## 2019-08-31 MED ORDER — SIMETHICONE 80 MG PO CHEW
80.0000 mg | CHEWABLE_TABLET | ORAL | Status: DC
  Administered 2019-08-31: 80 mg via ORAL
  Filled 2019-08-31: qty 1

## 2019-08-31 MED ORDER — METHYLERGONOVINE MALEATE 0.2 MG/ML IJ SOLN
INTRAMUSCULAR | Status: DC | PRN
  Administered 2019-08-31: .2 mg via INTRAMUSCULAR

## 2019-08-31 MED ORDER — VENLAFAXINE HCL ER 150 MG PO CP24
150.0000 mg | ORAL_CAPSULE | Freq: Every day | ORAL | Status: DC
  Administered 2019-08-31: 21:00:00 150 mg via ORAL
  Filled 2019-08-31: qty 1

## 2019-08-31 MED ORDER — LACTATED RINGERS IV SOLN: 500.0000 mL | Freq: Once | INTRAVENOUS | Status: DC

## 2019-08-31 MED ORDER — FENTANYL-BUPIVACAINE-NACL 0.5-0.125-0.9 MG/250ML-% EP SOLN: 12.0000 mL/h | EPIDURAL | Status: DC | PRN

## 2019-08-31 MED ORDER — LIDOCAINE-EPINEPHRINE (PF) 2 %-1:200000 IJ SOLN
INTRAMUSCULAR | Status: AC
  Filled 2019-08-31: qty 10

## 2019-08-31 MED ORDER — ONDANSETRON HCL 4 MG/2ML IJ SOLN
INTRAMUSCULAR | Status: AC
  Filled 2019-08-31: qty 2

## 2019-08-31 MED ORDER — ONDANSETRON HCL 4 MG/2ML IJ SOLN: 4.0000 mg | Freq: Once | INTRAMUSCULAR | Status: DC | PRN

## 2019-08-31 MED ORDER — ZOLPIDEM TARTRATE 5 MG PO TABS
5.0000 mg | ORAL_TABLET | Freq: Every evening | ORAL | Status: DC | PRN
  Administered 2019-08-31: 5 mg via ORAL
  Filled 2019-08-31: qty 1

## 2019-08-31 MED ORDER — DIPHENHYDRAMINE HCL 25 MG PO CAPS: 25.0000 mg | ORAL_CAPSULE | Freq: Four times a day (QID) | ORAL | Status: DC | PRN

## 2019-08-31 MED ORDER — SODIUM BICARBONATE 8.4 % IV SOLN
INTRAVENOUS | Status: DC | PRN
  Administered 2019-08-31: 5 mL via EPIDURAL
  Administered 2019-08-31: 4 mL via EPIDURAL

## 2019-08-31 MED ORDER — PROPOFOL 10 MG/ML IV BOLUS
INTRAVENOUS | Status: DC | PRN
  Administered 2019-08-31: 30 mg via INTRAVENOUS
  Administered 2019-08-31 (×2): 20 mg via INTRAVENOUS

## 2019-08-31 MED ORDER — WITCH HAZEL-GLYCERIN EX PADS: 1.0000 "application " | MEDICATED_PAD | CUTANEOUS | Status: DC | PRN

## 2019-08-31 MED ORDER — SENNOSIDES-DOCUSATE SODIUM 8.6-50 MG PO TABS
2.0000 | ORAL_TABLET | ORAL | Status: DC
  Administered 2019-08-31: 2 via ORAL
  Filled 2019-08-31: qty 2

## 2019-08-31 MED ORDER — DEXAMETHASONE SODIUM PHOSPHATE 10 MG/ML IJ SOLN
INTRAMUSCULAR | Status: DC | PRN
  Administered 2019-08-31: 10 mg via INTRAVENOUS

## 2019-08-31 MED ORDER — IBUPROFEN 800 MG PO TABS: 800.0000 mg | ORAL_TABLET | Freq: Four times a day (QID) | ORAL | Status: DC

## 2019-08-31 MED ORDER — PHENYLEPHRINE 40 MCG/ML (10ML) SYRINGE FOR IV PUSH (FOR BLOOD PRESSURE SUPPORT): 80.0000 ug | PREFILLED_SYRINGE | INTRAVENOUS | Status: DC | PRN

## 2019-08-31 MED ORDER — METHYLERGONOVINE MALEATE 0.2 MG/ML IJ SOLN
INTRAMUSCULAR | Status: AC
  Filled 2019-08-31: qty 1

## 2019-08-31 MED ORDER — DOCUSATE SODIUM 100 MG PO CAPS
100.0000 mg | ORAL_CAPSULE | Freq: Two times a day (BID) | ORAL | Status: DC
  Administered 2019-08-31 (×2): 100 mg via ORAL
  Filled 2019-08-31 (×2): qty 1

## 2019-08-31 MED ORDER — MIDAZOLAM HCL 2 MG/2ML IJ SOLN
INTRAMUSCULAR | Status: AC
  Filled 2019-08-31: qty 2

## 2019-08-31 MED ORDER — DEXAMETHASONE SODIUM PHOSPHATE 10 MG/ML IJ SOLN
INTRAMUSCULAR | Status: AC
  Filled 2019-08-31: qty 1

## 2019-08-31 MED ORDER — HYDROMORPHONE HCL 1 MG/ML IJ SOLN: 1.0000 mg | INTRAMUSCULAR | Status: DC | PRN

## 2019-08-31 MED ORDER — KETOROLAC TROMETHAMINE 30 MG/ML IJ SOLN
30.0000 mg | Freq: Four times a day (QID) | INTRAMUSCULAR | Status: DC
  Administered 2019-08-31 (×2): 30 mg via INTRAVENOUS
  Filled 2019-08-31 (×2): qty 1

## 2019-08-31 MED ORDER — MIDAZOLAM HCL 2 MG/2ML IJ SOLN
INTRAMUSCULAR | Status: DC | PRN
  Administered 2019-08-31: 2 mg via INTRAVENOUS

## 2019-08-31 MED ORDER — LACTATED RINGERS IV SOLN: INTRAVENOUS | Status: DC

## 2019-08-31 MED ORDER — LORATADINE 10 MG PO TABS: 10.0000 mg | ORAL_TABLET | Freq: Every day | ORAL | Status: DC | PRN

## 2019-08-31 MED ORDER — DIPHENHYDRAMINE HCL 50 MG/ML IJ SOLN: 12.5000 mg | INTRAMUSCULAR | Status: DC | PRN

## 2019-08-31 MED ORDER — DOXYCYCLINE HYCLATE 100 MG IV SOLR
200.0000 mg | INTRAVENOUS | Status: AC
  Administered 2019-08-31: 200 mg via INTRAVENOUS
  Filled 2019-08-31: qty 200

## 2019-08-31 MED ORDER — OXYTOCIN 10 UNIT/ML IJ SOLN
INTRAMUSCULAR | Status: AC
  Administered 2019-08-31: 10 [IU]
  Filled 2019-08-31: qty 1

## 2019-08-31 MED ORDER — PROPOFOL 10 MG/ML IV BOLUS
INTRAVENOUS | Status: AC
  Filled 2019-08-31: qty 20

## 2019-08-31 MED ORDER — TRAMADOL HCL 50 MG PO TABS: 50.0000 mg | ORAL_TABLET | Freq: Four times a day (QID) | ORAL | Status: DC | PRN

## 2019-08-31 MED ORDER — FENTANYL-BUPIVACAINE-NACL 0.5-0.125-0.9 MG/250ML-% EP SOLN
EPIDURAL | Status: AC
  Filled 2019-08-31: qty 250

## 2019-08-31 MED ORDER — FERROUS SULFATE 325 (65 FE) MG PO TABS
325.0000 mg | ORAL_TABLET | Freq: Two times a day (BID) | ORAL | Status: DC
  Administered 2019-08-31: 325 mg via ORAL
  Filled 2019-08-31: qty 1

## 2019-08-31 MED ORDER — DIBUCAINE (PERIANAL) 1 % EX OINT: 1.0000 "application " | TOPICAL_OINTMENT | CUTANEOUS | Status: DC | PRN

## 2019-08-31 MED ORDER — ONDANSETRON HCL 4 MG/2ML IJ SOLN
INTRAMUSCULAR | Status: DC | PRN
  Administered 2019-08-31: 4 mg via INTRAVENOUS

## 2019-08-31 MED ORDER — PHENYLEPHRINE 40 MCG/ML (10ML) SYRINGE FOR IV PUSH (FOR BLOOD PRESSURE SUPPORT)
80.0000 ug | PREFILLED_SYRINGE | INTRAVENOUS | Status: DC | PRN
  Administered 2019-08-31: 80 ug via INTRAVENOUS
  Filled 2019-08-31 (×2): qty 10

## 2019-08-31 SURGICAL SUPPLY — 19 items
CATH ROBINSON RED A/P 16FR (CATHETERS) ×3 IMPLANT
CLOTH BEACON ORANGE TIMEOUT ST (SAFETY) ×3 IMPLANT
DECANTER SPIKE VIAL GLASS SM (MISCELLANEOUS) ×3 IMPLANT
GLOVE BIOGEL PI IND STRL 7.0 (GLOVE) ×3 IMPLANT
GLOVE BIOGEL PI INDICATOR 7.0 (GLOVE) ×6
GLOVE ECLIPSE 7.0 STRL STRAW (GLOVE) ×3 IMPLANT
GOWN STRL REUS W/TWL LRG LVL3 (GOWN DISPOSABLE) ×6 IMPLANT
KIT BERKELEY 1ST TRIMESTER 3/8 (MISCELLANEOUS) ×3 IMPLANT
NS IRRIG 1000ML POUR BTL (IV SOLUTION) ×3 IMPLANT
PACK VAGINAL MINOR WOMEN LF (CUSTOM PROCEDURE TRAY) ×3 IMPLANT
PAD OB MATERNITY 4.3X12.25 (PERSONAL CARE ITEMS) ×3 IMPLANT
PAD PREP 24X48 CUFFED NSTRL (MISCELLANEOUS) ×3 IMPLANT
SET BERKELEY SUCTION TUBING (SUCTIONS) ×3 IMPLANT
TOWEL OR 17X24 6PK STRL BLUE (TOWEL DISPOSABLE) ×6 IMPLANT
VACURETTE 10 RIGID CVD (CANNULA) IMPLANT
VACURETTE 6 ASPIR F TIP BERK (CANNULA) IMPLANT
VACURETTE 7MM CVD STRL WRAP (CANNULA) IMPLANT
VACURETTE 8 RIGID CVD (CANNULA) IMPLANT
VACURETTE 9 RIGID CVD (CANNULA) IMPLANT

## 2019-08-31 NOTE — Progress Notes (Signed)
I offered support over several visits today and helped answer some questions related to funeral home arrangements and support resources for after they are home.   [place], [place] Pager, [phone] 3:02 PM

## 2019-08-31 NOTE — Progress Notes (Signed)
Faculty Practice OB/GYN Attending Note  Subjective:  Called to evaluate patient with increased vaginal bleeding s/p recent delivery. Placental fragment still undelivered.   Admitted on 08/30/2019 for Turner's syndrome complicating pregnancy in second trimester.    Objective:  Blood pressure 115/68, pulse (!) 102, temperature 98.4 F (36.9 C), temperature source Oral, resp. rate 18, height 5\' 3"  (1.6 m), weight 91.2 kg, SpO2 95 %.  Gen: NAD Lungs: Normal respiratory effort Heart: Tachycardia noted Abdomen: NT, , soft Pelvic: About 300 ml of clots in blood in vagina. Placental fragment still adherent, even after maternal pushing.  Ext: 2+ DTRs, no edema, no cyanosis, negative Homan's sign  Assessment & Plan:  38 y.o. G3P0020 s/p delivery at [redacted]w[redacted]d after IOL for Turner's syndrome, multiple anomalies now with retained placental portion and hemorrhage.  EBL at least 500 ml so far.  She was counseled regarding need for Dilation and Evacuation under ultrasound guidance.  Risks of surgery including bleeding, infection, injury to surrounding organs, need for additional procedures, possibility of intrauterine scarring, risk of retained products which may require further management and other postoperative/anesthesia complications were explained to patient.  Likelihood of success of complete evacuation of the uterus was discussed with the patient.  Written informed consent was obtained.  Anesthesia and OR aware.  Preoperative prophylactic Doxycycline 200mg  IV has been ordered and is on call to the OR.  To OR when ready.    Verita Schneiders, MD, [place] for [employer], [place]

## 2019-08-31 NOTE — Progress Notes (Signed)
LABOR PROGRESS NOTE  Kelly Lopez is a 38 y.o. G3P0020 at [redacted]w[redacted]d  admitted for IOL for fetal anomolies.  Subjective: More comfortable w epidural  Objective: BP 112/66   Pulse 87   Temp 99.1 F (37.3 C) (Oral)   Resp 16   Ht 5\' 3"  (1.6 m)   Wt 91.2 kg   SpO2 95%   BMI 35.61 kg/m  or  Vitals:   08/31/19 0215 08/31/19 0220 08/31/19 0225 08/31/19 0230  BP: (!) 94/55 (!) 98/59 109/69 112/66  Pulse: 84 88 91 87  Resp:      Temp:      TempSrc:      SpO2: 95% 95% 97% 95%  Weight:      Height:         Dilation: Closed Effacement (%): 50 Cervical Position: Middle Station: Ballotable Presentation: Pilar Plate Breech Exam by:: Lenise Herald RN   Labs: Lab Results  Component Value Date   WBC 9.7 08/30/2019   HGB 12.4 08/30/2019   HCT 36.3 08/30/2019   MCV 88.8 08/30/2019   PLT 318 08/30/2019    Patient Active Problem List   Diagnosis Date Noted  . 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 Fetal hydrops 08/30/2019  . 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 Obesity affecting pregnancy, antepartum 07/11/2019  . Anxiety and depression 07/11/2019  . AMA (advanced maternal age) multigravida 67+, second trimester 06/19/2019  . History of miscarriage, currently pregnant, first trimester 06/19/2019  . Subchorionic hemorrhage of placenta in first trimester 06/19/2019  . Supervision of high risk pregnancy, antepartum 06/19/2019  . 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 G3P0020 at [redacted]w[redacted]d here for IOL for fetal anomalies..  Labor: cont miso 620mcg buccal q4h, due for another dose soon. FB placed at 1740 still in place on traction.  Pain Control:  Epidural in place  Augustin Coupe, MD/MPH OB Fellow  08/31/2019, 3:12 AM

## 2019-08-31 NOTE — Transfer of Care (Signed)
Immediate Anesthesia Transfer of Care Note  Patient: Kelly Lopez  Procedure(s) Performed: DILATATION AND EVACUATION (N/A )  Patient Location: PACU  Anesthesia Type:Epidural  Level of Consciousness: awake, alert  and oriented  Airway & Oxygen Therapy: Patient Spontanous Breathing  Post-op Assessment: Report given to RN and Post -op Vital signs reviewed and stable  Post vital signs: Reviewed and stable  Last Vitals:  Vitals Value Taken Time  BP    Temp    Pulse    Resp    SpO2      Last Pain:  Vitals:   08/31/19 0530  TempSrc: Oral  PainSc: Asleep         Complications: No apparent anesthesia complications

## 2019-08-31 NOTE — Anesthesia Procedure Notes (Signed)
Epidural Patient location during procedure: OB Start time: 08/31/2019 1:54 AM End time: 08/31/2019 2:07 AM  Staffing Anesthesiologist: Lynda Rainwater, MD Performed: anesthesiologist   Preanesthetic Checklist Completed: patient identified, IV checked, site marked, risks and benefits discussed, surgical consent, monitors and equipment checked, pre-op evaluation and timeout performed  Epidural Patient position: sitting Prep: ChloraPrep Patient monitoring: heart rate, cardiac monitor, continuous pulse ox and blood pressure Approach: midline Location: L2-L3 Injection technique: LOR saline  Needle:  Needle type: Tuohy  Needle gauge: 17 G Needle length: 9 cm Needle insertion depth: 5 cm Catheter type: closed end flexible Catheter size: 20 Guage Catheter at skin depth: 9 cm Test dose: negative  Assessment Events: blood not aspirated, injection not painful, no injection resistance, no paresthesia and negative IV test  Additional Notes Reason for block:procedure for pain

## 2019-08-31 NOTE — Anesthesia Preprocedure Evaluation (Signed)

## 2019-08-31 NOTE — Anesthesia Postprocedure Evaluation (Signed)
Anesthesia Post Note  Patient: Kelly Lopez  Procedure(s) Performed: DILATATION AND EVACUATION (N/A )     Patient location during evaluation: Mother Baby Anesthesia Type: Epidural Level of consciousness: awake and alert Pain management: pain level controlled Vital Signs Assessment: post-procedure vital signs reviewed and stable Respiratory status: spontaneous breathing, nonlabored ventilation and respiratory function stable Cardiovascular status: stable Postop Assessment: no headache, no backache and epidural receding Anesthetic complications: no    Last Vitals:  Vitals:   08/31/19 0924 08/31/19 1027  BP: 130/73 125/73  Pulse: 100 99  Resp: 18 18  Temp: 37.6 C 37.5 C  SpO2: 97% 98%    Last Pain:  Vitals:   08/31/19 1027  TempSrc: Oral  PainSc:    Pain Goal:                   Omaria Plunk

## 2019-08-31 NOTE — Anesthesia Postprocedure Evaluation (Signed)
Anesthesia Post Note  Patient: Kelly Lopez  Procedure(s) Performed: DILATATION AND EVACUATION (N/A )     Patient location during evaluation: PACU Anesthesia Type: Epidural Level of consciousness: oriented and awake and alert Pain management: pain level controlled Vital Signs Assessment: post-procedure vital signs reviewed and stable Respiratory status: spontaneous breathing, respiratory function stable and nonlabored ventilation Cardiovascular status: blood pressure returned to baseline and stable Postop Assessment: no headache, no backache, no apparent nausea or vomiting, epidural receding and patient able to bend at knees Anesthetic complications: no    Last Vitals:  Vitals:   08/31/19 0846 08/31/19 0901  BP: 118/62 117/74  Pulse: 98 94  Resp: 20 (!) 23  Temp:    SpO2: 97% 92%    Last Pain:  Vitals:   08/31/19 0901  TempSrc:   PainSc: 0-No pain   Pain Goal:                Epidural/Spinal Function Cutaneous sensation: Pins and Needles (08/31/19 0846), Patient able to flex knees: Yes (08/31/19 0846), Patient able to lift hips off bed: Yes (08/31/19 0846), Back pain beyond tenderness at insertion site: No (08/31/19 0846), Progressively worsening motor and/or sensory loss: No (08/31/19 0846), Bowel and/or bladder incontinence post epidural: No (08/31/19 0846)  Desirre Eickhoff A.

## 2019-08-31 NOTE — Op Note (Signed)
Kelly Lopez PROCEDURE DATE:  08/31/2019  PREOPERATIVE DIAGNOSIS: Retained placental fragments with associated hemorrhage POSTOPERATIVE DIAGNOSIS: The same PROCEDURE:  Dilation and Evacuation under ultrasound guidance SURGEON:  Dr. Verita Schneiders ASSISTANT: Dr. Clayton Lefort  INDICATIONS: 38 y.o.  G3P0020 with retained placental fragments and hemorrhage after misoprostol induction at [redacted]w[redacted]d, needing urgent surgical completion.  Risks of surgery were discussed with the patient and her husband including but not limited to: bleeding which may require transfusion; infection which may require antibiotics; injury to uterus or surrounding organs; need for additional procedures; possibility of intrauterine scarring; risk of retained products which may require further management  and other postoperative/anesthesia complications. Written informed consent was obtained.  FINDINGS:  Tightly adherent placental fragments in fundus and lower uterine segmen with significant clots and blood.  Total EBL including delivery about 700 ml.    ANESTHESIA:    Epidural ESTIMATED BLOOD LOSS:  200 ml in OR (700 ml since delivery) SPECIMENS:  Placental fragments sent to pathology COMPLICATIONS:  None immediate.  PROCEDURE DETAILS:  The patient was then taken to the operating room where epidural was dosed to surgical level and was found to be adequate.  After an adequate timeout was performed, she was placed in the dorsal lithotomy position and examined; then prepped and draped in the sterile manner.   A vaginal speculum was then placed in the patient's vagina and a ring forceps was applied to the anterior lip of the cervix.  The cervix was already about 1.5 cm dilated; forceps were advanced into the uterus to grasp and remove some placenta in fragments under ultrasound guidance.  A 16 mm suction curette was then advanced into the uterus, the suction device was then activated and curette slowly rotated to clear further  placental fragments.  IM Methergine was administered to help with uterine tone.   The bleeding was noted to subside significantly.  All instruments were removed from the patient's vagina.  Sponge and instrument counts were correct times three.  The patient tolerated the procedure well and was taken to the recovery area awake and in stable condition.  Given patient's situation, patient will be observed overnight on OBSC as per her preference.  Will discharge in the morning if she remains stable.     Verita Schneiders, MD, [place] Attending [place], [facility]

## 2019-08-31 NOTE — Discharge Summary (Signed)
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 yo G3P0020 at 239w1das admitted to the hospital 08/30/2019 for induction of labor.  Indication for induction: Turner syndrome with multiple fetal anomalies.  Patient had an uncomplicated labor course as follows: induction started with misoprostol 800 mcg PV followed by foley bulb with unintentional AROM. She received total of 5 doses of misoprostol at which point foley balloon came out followed shortly by infant.  Membrane Rupture Time/Date: 5:20 PM ,08/30/2019   Intrapartum Procedures: Episiotomy: None [1]                                         Lacerations:  None [1]  Patient had delivery of a Non Viable infant.  Information for the patient's newborn:  RuKymberley, Raz0[[id number]]Delivery Method: Vaginal, Spontaneous(Filed from Delivery Summary)    08/31/2019  Details of delivery can be found in separate delivery note. Patient was taken to the OR  for D&E due to retained placenta. The remained of her postpartum stay way uncomplicated. Patient is discharged home 09/01/19. Delivery time: 6:05 AM    Magnesium Sulfate received: No BMZ received: No Rhophylac:N/A MMR:N/A Transfusion:No  Physical exam  Vitals:   08/31/19 1512 08/31/19 1944 08/31/19 1945 09/01/19 0603  BP: 118/74 130/78  124/75  Pulse: (!) 106 (!) 101  (!) 113  Resp: 18 18  18   Temp: 98.4 F (36.9 C) 98.4 F (36.9 C)  98.6 F (37 C)  TempSrc: Oral Oral  Oral  SpO2: 95% 99% 97% 98%  Weight:      Height:       General: alert, cooperative and no distress Lochia: appropriate Uterine Fundus: firm DVT Evaluation: No evidence of DVT seen on physical exam. Labs: Lab Results  Component Value Date   WBC 11.6 (H) 09/01/2019   HGB 9.9 (L) 09/01/2019   HCT 30.3 (L) 09/01/2019   MCV 93.2 09/01/2019   PLT 246 09/01/2019   CMP Latest Ref Rng & Units 08/30/2019  Glucose  70 - 99 mg/dL 123(H)  BUN 6 - 20 mg/dL <5(L)  Creatinine 0.44 - 1.00 mg/dL 0.46  Sodium 135 - 145 mmol/L 138  Potassium 3.5 - 5.1 mmol/L 3.1(L)  Chloride 98 - 111 mmol/L 107  CO2 22 - 32 mmol/L 20(L)  Calcium 8.9 - 10.3 mg/dL 8.6(L)  Total Protein 6.5 - 8.1 g/dL -  Total Bilirubin 0.3 - 1.2 mg/dL -  Alkaline Phos 38 - 126 U/L -  AST 15 - 41 U/L -  ALT 0 - 44 U/L -    Discharge instruction: per After Visit Summary and "Baby and Me Booklet".  After visit meds:  Allergies as of 09/01/2019      Reactions   Sertraline Hcl Other (See Comments)      Medication List    TAKE these medications   diphenhydrAMINE 25 mg capsule Commonly known as: BENADRYL Take 25 mg by mouth at bedtime as needed for sleep.   docusate sodium 100 MG capsule Commonly known as: COLACE Take 100 mg by mouth 2 (two) times daily.   ferrous sulfate 325 (65 FE) MG tablet Take 1 tablet (325 mg total) by mouth 2 (two) times daily with a meal.   ibuprofen 800 MG tablet Commonly known as: ADVIL Take 1 tablet (800 mg  total) by mouth every 6 (six) hours.   loratadine 10 MG tablet Commonly known as: CLARITIN Take 10 mg by mouth daily as needed for allergies.   multivitamin-prenatal 27-0.8 MG Tabs tablet Take 1 tablet by mouth daily at 12 noon.   omeprazole 20 MG capsule Commonly known as: PRILOSEC Take 20 mg by mouth daily.   ondansetron 4 MG disintegrating tablet Commonly known as: Zofran ODT Take 1 tablet (4 mg total) by mouth every 8 (eight) hours as needed for nausea or vomiting. Allow 1-2 tablets to dissolve in your mouth every 8 hours as needed for nausea/vomiting   traMADol 50 MG tablet Commonly known as: ULTRAM Take 1 tablet (50 mg total) by mouth every 6 (six) hours as needed (mild pain).   venlafaxine XR 150 MG 24 hr capsule Commonly known as: EFFEXOR-XR Take 150 mg by mouth at bedtime.       Diet: routine diet  Activity: Advance as tolerated. Pelvic rest for 6 weeks.   Outpatient follow up:4 weeks Follow up Appt: Future Appointments  Date Time Provider [place]  09/05/2019  1:45 PM Caren Macadam, MD [facility]  09/12/2019  3:15 PM [place] [facility]   Follow up Visit: [place] for [place] at [facility] Follow up on 09/05/2019.   Specialty: Obstetrics and Gynecology Why: As scheduled for followup Contact information: [place] [phone]           Please schedule this patient for Postpartum visit in: 4 weeks with the following provider: Any provider For C/S patients schedule nurse incision check in weeks 2 weeks: no High risk pregnancy complicated by: Turner syndrome with multiple fetal anomalies Delivery mode:  SVD Anticipated Birth Control:  OCPs PP Procedures needed: none  Schedule Integrated BH visit: yes      Newborn Data: Live born female  Birth Weight:   APGAR: 0, 0  Newborn Delivery   Birth date/time:  08/31/2019 06:05:00 Delivery type: Vaginal, Spontaneous      Baby Feeding: n/a Disposition:morgue   09/01/2019 Mora Bellman, MD

## 2019-08-31 NOTE — Addendum Note (Signed)
Addendum  created 08/31/19 1224 by Ignacia Bayley, CRNA   Clinical Note Signed

## 2019-09-01 ENCOUNTER — Encounter: Payer: Self-pay | Admitting: *Deleted

## 2019-09-01 LAB — CBC
HCT: 30.3 % — ABNORMAL LOW (ref 36.0–46.0)
Hemoglobin: 9.9 g/dL — ABNORMAL LOW (ref 12.0–15.0)
MCH: 30.5 pg (ref 26.0–34.0)
MCHC: 32.7 g/dL (ref 30.0–36.0)
MCV: 93.2 fL (ref 80.0–100.0)
Platelets: 246 10*3/uL (ref 150–400)
RBC: 3.25 MIL/uL — ABNORMAL LOW (ref 3.87–5.11)
RDW: 13.1 % (ref 11.5–15.5)
WBC: 11.6 10*3/uL — ABNORMAL HIGH (ref 4.0–10.5)
nRBC: 0 % (ref 0.0–0.2)

## 2019-09-01 MED ORDER — TRAMADOL HCL 50 MG PO TABS: 50.0000 mg | ORAL_TABLET | Freq: Four times a day (QID) | ORAL | 0 refills | Status: DC | PRN

## 2019-09-01 MED ORDER — FERROUS SULFATE 325 (65 FE) MG PO TABS: 325.0000 mg | ORAL_TABLET | Freq: Two times a day (BID) | ORAL | 3 refills | Status: DC

## 2019-09-01 MED ORDER — IBUPROFEN 800 MG PO TABS: 800.0000 mg | ORAL_TABLET | Freq: Four times a day (QID) | ORAL | 3 refills | Status: DC

## 2019-09-01 NOTE — Lactation Note (Signed)
Lactation Consultation Note  Patient Name: Kelly Lopez Date: 09/01/2019   I met w/Mom and provided her w/the main portion of the Lactation After Loss brochure. We discussed cabbage leaves; diluting peppermint oil & rubbing on breasts; Tylenol/IB (as Rx by her provider). A hand pump was provided so that Mom can express if she begins to become uncomfortably full. Mom understands that the pump is not to be used to empty her breasts, but to express some milk for her comfort. Mom was shown how to use the hand pump; a size 27 or 30  flange is appropriate for her, which I provided.   S/sx of mastitis discussed. Mom knows how to get in touch w/the lactation dept if she has further questions once she gets home.  Matthias Hughs [facility] 09/01/2019, 8:31 AM

## 2019-09-01 NOTE — Discharge Instructions (Signed)

## 2019-09-03 ENCOUNTER — Encounter (HOSPITAL_COMMUNITY): Payer: Self-pay | Admitting: Genetic Counselor

## 2019-09-03 LAB — SURGICAL PATHOLOGY

## 2019-09-04 LAB — CHROMOSOME ANALYSIS W REFLEX TO SNP, AMNIOTIC
Cells Analyzed: 15
Cells Counted: 15
Cells Karyotyped: 2
Colonies: 15
GTG Band Resolution Achieved: 450

## 2019-09-04 LAB — INSIGHT AMNIO FISH XY,13,18,21
Cells Analyzed: 50
Cells Counted: 50

## 2019-09-04 LAB — AFP, AMNIOTIC FLUID
AFP, Amniotic Fluid (mcg/ml): 7.1 ug/mL
Gestational Age(Wks): 18
MOM, Amniotic Fluid: 0.78

## 2019-09-04 LAB — MATERNAL CELL CONTAMINATION

## 2019-09-04 LAB — MCC TRACKING

## 2019-09-05 ENCOUNTER — Ambulatory Visit (INDEPENDENT_AMBULATORY_CARE_PROVIDER_SITE_OTHER): Payer: PRIVATE HEALTH INSURANCE | Admitting: Family Medicine

## 2019-09-05 ENCOUNTER — Other Ambulatory Visit: Payer: Self-pay

## 2019-09-05 ENCOUNTER — Encounter: Payer: Self-pay | Admitting: Family Medicine

## 2019-09-05 ENCOUNTER — Telehealth: Payer: Commercial Managed Care - PPO | Admitting: Family Medicine

## 2019-09-05 VITALS — BP 126/74 | HR 73 | Wt 195.0 lb

## 2019-09-05 DIAGNOSIS — O99891 Other specified diseases and conditions complicating pregnancy: Secondary | ICD-10-CM

## 2019-09-05 DIAGNOSIS — F419 Anxiety disorder, unspecified: Secondary | ICD-10-CM

## 2019-09-05 DIAGNOSIS — Z6831 Body mass index (BMI) 31.0-31.9, adult: Secondary | ICD-10-CM

## 2019-09-05 DIAGNOSIS — F329 Major depressive disorder, single episode, unspecified: Secondary | ICD-10-CM | POA: Diagnosis not present

## 2019-09-05 DIAGNOSIS — Z8759 Personal history of other complications of pregnancy, childbirth and the puerperium: Secondary | ICD-10-CM

## 2019-09-05 DIAGNOSIS — E6609 Other obesity due to excess calories: Secondary | ICD-10-CM

## 2019-09-05 DIAGNOSIS — Q969 Turner's syndrome, unspecified: Secondary | ICD-10-CM

## 2019-09-05 NOTE — BH Specialist Note (Signed)
Integrated Behavioral Health via Telemedicine Video Visit  09/05/2019 Kelly Lopez [id number]  Number of [place] visits: 2 Session Start time: 3:25  Session End time: 3:40 Total time: 15  Referring Provider: Verita Schneiders, MD Type of Visit: Video Patient/Family location: Home [facility] Provider location: [facility] All persons participating in visit: Patient Kelly Lopez and [place]    Confirmed patient's address: Yes  Confirmed patient's phone number: Yes  Any changes to demographics: No   Confirmed patient's insurance: Yes  Any changes to patient's insurance: No   Discussed confidentiality: At previous visit  I connected with [name]  by a video enabled telemedicine application and verified that I am speaking with the correct person using two identifiers.     I discussed the limitations of evaluation and management by telemedicine and the availability of in person appointments.  I discussed that the purpose of this visit is to provide behavioral health care while limiting exposure to the novel coronavirus.   Discussed there is a possibility of technology failure and discussed alternative modes of communication if that failure occurs.  I discussed that engaging in this video visit, they consent to the provision of behavioral healthcare and the services will be billed under their insurance.  Patient and/or legal guardian expressed understanding and consented to video visit: Yes   PRESENTING CONCERNS: Patient and/or family reports the following symptoms/concerns: Pt states she is coping well with loss, with support of family; looks forward to going back to work after the holidays.  Duration of problem: Less than 2 weeks; Severity of problem: -  STRENGTHS (Protective Factors/Coping Skills): Strong social support; positive outlook  GOALS ADDRESSED: Patient will: 1.  Demonstrate ability to: Continue healthy grieving over  loss  INTERVENTIONS: Interventions utilized:  Supportive Counseling and Link to [employer] Standardized Assessments completed: Not Needed  ASSESSMENT: Patient currently experiencing Grief  Patient may benefit from supportive counseling today and referral to additional grief support.  PLAN: 1. Follow up with behavioral health clinician on : As needed 2. Behavioral recommendations:  -Consider resources given for additional grief support, as needed 3. Referral(s): Integrated Orthoptist (In Clinic) and [employer] Resources:  Online grief support  I discussed the assessment and treatment plan with the patient and/or parent/guardian. They were provided an opportunity to ask questions and all were answered. They agreed with the plan and demonstrated an understanding of the instructions.   They were advised to call back or seek an in-person evaluation if the symptoms worsen or if the condition fails to improve as anticipated.  [name] [name]

## 2019-09-06 LAB — RPR: RPR Ser Ql: NONREACTIVE — AB

## 2019-09-08 ENCOUNTER — Encounter: Payer: Self-pay | Admitting: Family Medicine

## 2019-09-08 DIAGNOSIS — Z8759 Personal history of other complications of pregnancy, childbirth and the puerperium: Secondary | ICD-10-CM | POA: Insufficient documentation

## 2019-09-08 NOTE — Progress Notes (Signed)
   POST-Delivery/Fetal Loss Follow up VISIT ENCOUNTER NOTE  Subjective:   Kelly Lopez is a 38 y.o. G86P0120 female here to follow up after induction of labor 2/2 to fetal hydrops related to Turner's syndrome.  Patient reports she is doing as well as can be expected. She reports she and her partner have grown closer through the process of her induction. Yer reports she was at peace with decision to terminate the pregnancy once she saw her daughter and the severe cystic hydroma and hydrops.  She reports feeling of anger about why she could not have a health pregnancy. She reports crying frequently and feels like she is "going through the motions" most days. She is able to get up and take care of herself and her pets. She does say she knows her depression is flaring because she is unmotivated to shower and this has been a signal in the past.  She has scheduled follow up with [facility].   Reports bleeding after delivery has stopped.  Denies abnormal vaginal bleeding, discharge, pelvic pain, problems with intercourse or other gynecologic concerns.    Gynecologic History No LMP recorded. Contraception: none  Health Maintenance Due  Topic Date Due  . HIV Screening  06/17/1998     The following portions of the patient's history were reviewed and updated as appropriate: allergies, current medications, past family history, past medical history, past social history, past surgical history and problem list.  Review of Systems Pertinent items are noted in HPI.   Objective:  BP 126/74   Pulse 73   Wt 195 lb (88.5 kg)   BMI 34.54 kg/m  Gen: well appearing, NAD HEENT: no scleral icterus CV: RR Lung: Normal WOB Ext: warm well perfused  Mood: Somewhat withdrawn but interactive. Makes eye contact. + depressive symptoms. Judgment good. Thinking linear.    Assessment and Plan:   1. H/O fetal loss Medically stable and appropriate  Reviewed return of fertility Discussed medically safe to try  for pregnancy again after resumed menses  Recommended continued use of prenatal vitamins and ferrous sulfate given her hemorrhage Plan to recheck hgb at next visit to assure rebound  2. 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 Anxiety and depression Recommended continued involvement with Roselyn Reef  Patient is taking Effexor which does help with sx Discussed possible need for adjuvant therapy but also normal acute worsening of depression with grief reaction Patient interested in establishing more long term grief counseling and I will get her some resources and also encouraged her to talk to Roselyn Reef as well Mychart message sent with counselor options 12/19 Discussed [facility] support group  Face to face time:  25 minutes  Greater than 50% of the visit time was spent in counseling and coordination of care with the patient.  The summary and outline of the counseling and care coordination is summarized in the note above.   All questions were answered.  Please refer to After Visit Summary for other counseling recommendations.   Return in about 2 months (around 11/06/2019) for follow up mood/fetal loss.  Caren Macadam, MD, MPH, ABFM Attending [place] for [facility]

## 2019-09-12 ENCOUNTER — Ambulatory Visit: Payer: Commercial Managed Care - PPO | Admitting: Clinical

## 2019-09-12 ENCOUNTER — Other Ambulatory Visit: Payer: Self-pay

## 2019-09-12 DIAGNOSIS — F4321 Adjustment disorder with depressed mood: Secondary | ICD-10-CM

## 2019-10-31 ENCOUNTER — Ambulatory Visit: Payer: Commercial Managed Care - PPO | Admitting: Family Medicine

## 2019-11-14 ENCOUNTER — Ambulatory Visit: Payer: PRIVATE HEALTH INSURANCE | Admitting: Family Medicine

## 2020-05-09 ENCOUNTER — Ambulatory Visit: Payer: Self-pay | Admitting: Family Medicine

## 2020-05-13 IMAGING — DX PORTABLE CHEST - 1 VIEW
1 series · 1 of 1 positions shown · non-contrast
Comparison: None.

CLINICAL DATA: Cough and fever

EXAM:
PORTABLE CHEST 1 VIEW

[chest ap]
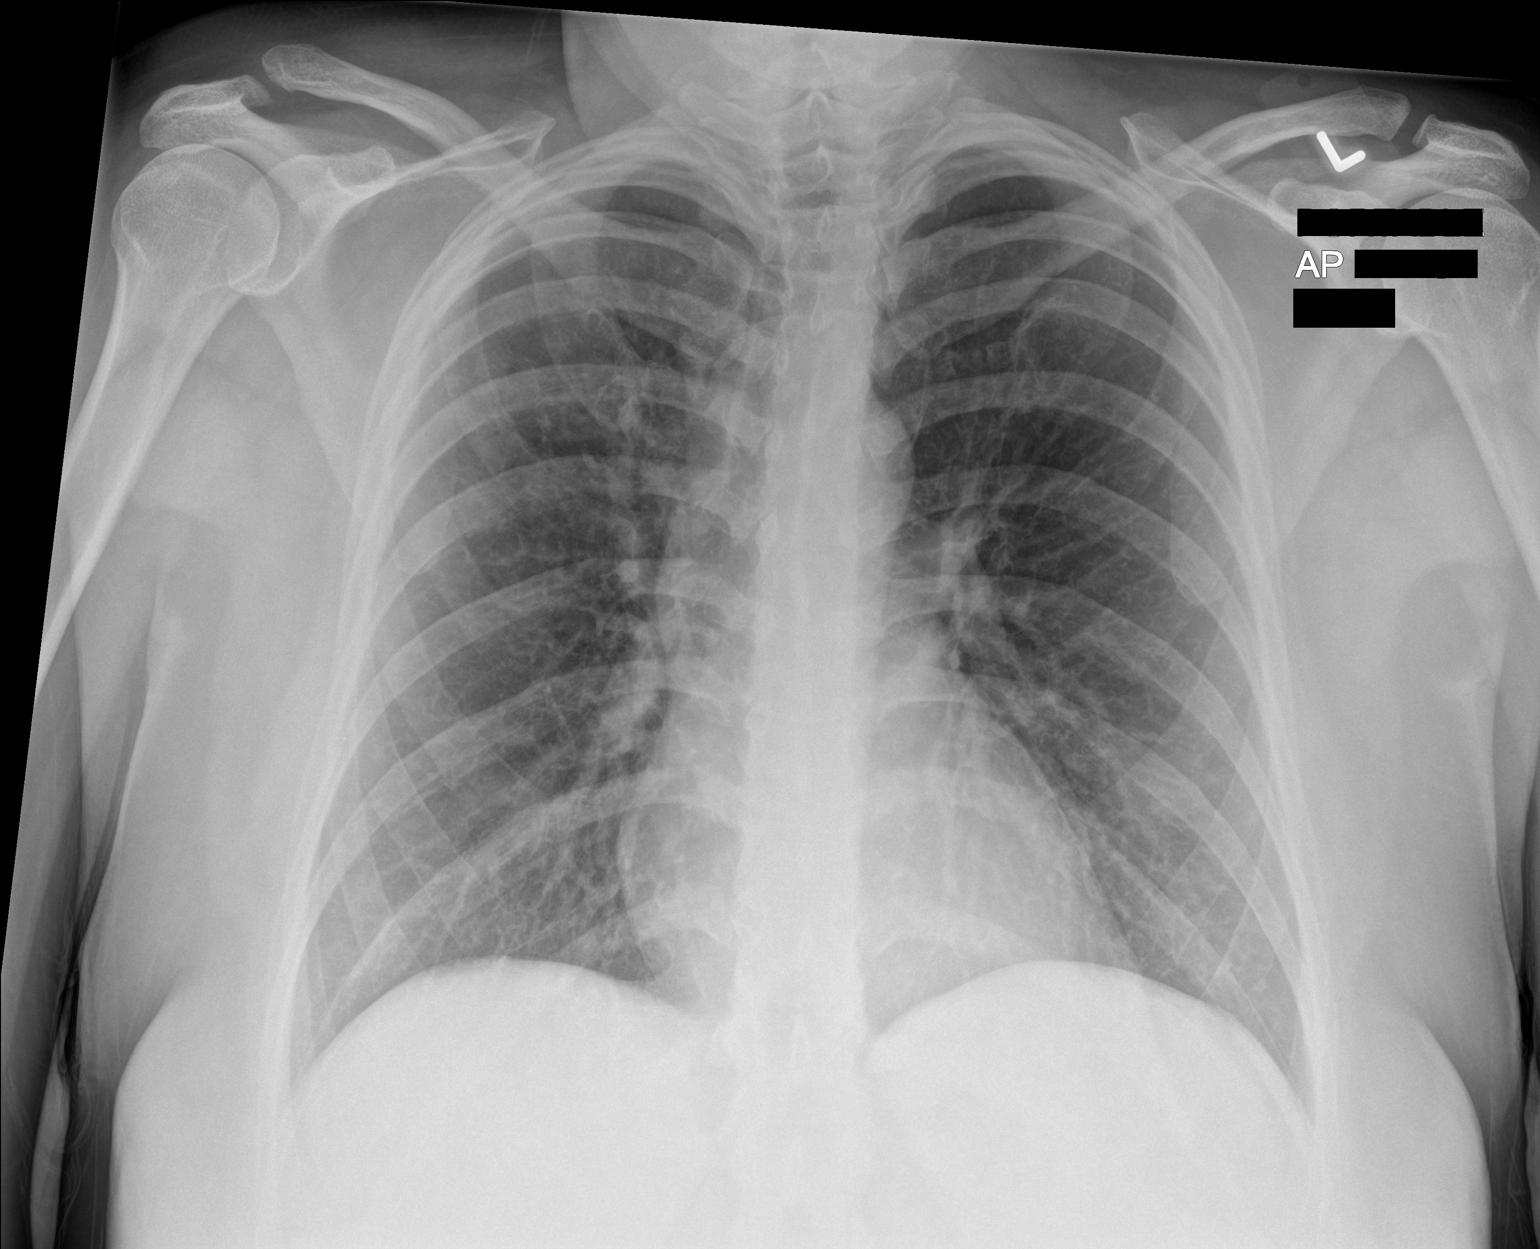

[1 of 1 positions shown; findings below may reference images not displayed]

FINDINGS: The heart size and mediastinal contours are within normal limits.
Both lungs are clear. The visualized skeletal structures are
unremarkable.
IMPRESSION: No active disease.

## 2020-06-12 ENCOUNTER — Ambulatory Visit (INDEPENDENT_AMBULATORY_CARE_PROVIDER_SITE_OTHER): Payer: PRIVATE HEALTH INSURANCE | Admitting: Family Medicine

## 2020-06-12 ENCOUNTER — Encounter: Payer: Self-pay | Admitting: Family Medicine

## 2020-06-12 ENCOUNTER — Other Ambulatory Visit: Payer: Self-pay

## 2020-06-12 VITALS — BP 136/81 | HR 89 | Ht 63.0 in | Wt 205.0 lb

## 2020-06-12 DIAGNOSIS — Z3169 Encounter for other general counseling and advice on procreation: Secondary | ICD-10-CM | POA: Diagnosis not present

## 2020-06-12 NOTE — Progress Notes (Signed)
   Subjective:    Patient ID: Kelly Lopez is a 39 y.o. female presenting with Fertility Consult  on 06/12/2020  HPI: G1 39 yo TAB G2 Blighted ovum--cytotec G3 (08/2019) Turner's Syndrome with large cystic hygroma, IOL with D and C for retained placenta Cycles are regular and light since Same partner. Trying to get pregnant since beginning of the year with no luck. Has app to track cycle with ovulation prediction.  Review of Systems  Constitutional: Negative for chills and fever.  Respiratory: Negative for shortness of breath.   Cardiovascular: Negative for chest pain.  Gastrointestinal: Negative for abdominal pain, nausea and vomiting.  Genitourinary: Negative for dysuria.  Skin: Negative for rash.      Objective:    BP 136/81   Pulse 89   Ht 5\' 3"  (1.6 m)   Wt 205 lb (93 kg)   LMP 06/05/2020 (Exact Date)   BMI 36.31 kg/m  Physical Exam Constitutional:      General: She is not in acute distress.    Appearance: She is well-developed.  HENT:     Head: Normocephalic and atraumatic.  Eyes:     General: No scleral icterus. Cardiovascular:     Rate and Rhythm: Normal rate.  Pulmonary:     Effort: Pulmonary effort is normal.  Abdominal:     Palpations: Abdomen is soft.  Musculoskeletal:     Cervical back: Neck supple.  Skin:    General: Skin is warm and dry.  Neurological:     Mental Status: She is alert and oriented to person, place, and time.         Assessment & Plan:   Problem List Items Addressed This Visit      Unprioritized   Infertility counseling - Primary    Trying to conceive since 3 months after loss last year. Had d and C. Same partner. Regular cycles. Check HSG to r/o uterine anomalies following surgery. Check TSH and A1C to r/o medical problems. Check AMH for ovarian reserve. Quick referral to REI given age.      Relevant Orders   TSH   Hemoglobin A1c   DG Hysterogram (HSG)   Anti mullerian hormone      Total time in review of  prior notes, pathology, labs, history taking, review with patient, exam, note writing, discussion of options, plan for next steps, alternatives and risks of treatment: 20 minutes.  Return in about 4 weeks (around 07/10/2020).  07/12/2020 06/12/2020 5:27 PM

## 2020-06-12 NOTE — Assessment & Plan Note (Signed)
Trying to conceive since 3 months after loss last year. Had d and C. Same partner. Regular cycles. Check HSG to r/o uterine anomalies following surgery. Check TSH and A1C to r/o medical problems. Check AMH for ovarian reserve. Quick referral to REI given age.

## 2020-06-13 LAB — HEMOGLOBIN A1C
Est. average glucose Bld gHb Est-mCnc: 97 mg/dL
Hgb A1c MFr Bld: 5 % (ref 4.8–5.6)

## 2020-06-13 LAB — TSH: TSH: 1.35 u[IU]/mL (ref 0.450–4.500)

## 2020-06-18 LAB — ANTI MULLERIAN HORMONE: ANTI-MULLERIAN HORMONE (AMH): 3.88 ng/mL

## 2020-06-19 DIAGNOSIS — F3341 Major depressive disorder, recurrent, in partial remission: Secondary | ICD-10-CM | POA: Insufficient documentation

## 2020-07-17 ENCOUNTER — Ambulatory Visit
Admission: RE | Admit: 2020-07-17 | Discharge: 2020-07-17 | Disposition: A | Discharge status: Home / Self Care | Payer: No Typology Code available for payment source | Source: Ambulatory Visit | Attending: Family Medicine | Admitting: Family Medicine

## 2020-07-17 DIAGNOSIS — Z3169 Encounter for other general counseling and advice on procreation: Secondary | ICD-10-CM

## 2020-09-16 ENCOUNTER — Emergency Department
Admission: EM | Admit: 2020-09-16 | Discharge: 2020-09-16 | Disposition: A | Discharge status: Home / Self Care | Payer: No Typology Code available for payment source | Attending: Emergency Medicine | Admitting: Emergency Medicine

## 2020-09-16 ENCOUNTER — Emergency Department: Payer: No Typology Code available for payment source

## 2020-09-16 ENCOUNTER — Other Ambulatory Visit: Payer: Self-pay

## 2020-09-16 DIAGNOSIS — R102 Pelvic and perineal pain: Secondary | ICD-10-CM | POA: Diagnosis present

## 2020-09-16 DIAGNOSIS — R067 Sneezing: Secondary | ICD-10-CM | POA: Diagnosis not present

## 2020-09-16 DIAGNOSIS — N390 Urinary tract infection, site not specified: Secondary | ICD-10-CM | POA: Insufficient documentation

## 2020-09-16 DIAGNOSIS — R1032 Left lower quadrant pain: Secondary | ICD-10-CM

## 2020-09-16 LAB — URINALYSIS, COMPLETE (UACMP) WITH MICROSCOPIC
Bilirubin Urine: NEGATIVE
Glucose, UA: NEGATIVE mg/dL
Hgb urine dipstick: NEGATIVE
Ketones, ur: NEGATIVE mg/dL
Leukocytes,Ua: NEGATIVE
Nitrite: NEGATIVE
Protein, ur: NEGATIVE mg/dL
Specific Gravity, Urine: 1.027 (ref 1.005–1.030)
WBC, UA: >50 WBC/hpf — ABNORMAL HIGH (ref 0–5)
pH: 5 (ref 5.0–8.0)

## 2020-09-16 LAB — BASIC METABOLIC PANEL
Anion gap: 9 (ref 5–15)
BUN: 11 mg/dL (ref 6–20)
CO2: 27 mmol/L (ref 22–32)
Calcium: 9.4 mg/dL (ref 8.9–10.3)
Chloride: 104 mmol/L (ref 98–111)
Creatinine, Ser: 0.79 mg/dL (ref 0.44–1.00)
GFR, Estimated: >60 mL/min (ref >60)
Glucose, Bld: 101 mg/dL — ABNORMAL HIGH (ref 70–99)
Potassium: 4.4 mmol/L (ref 3.5–5.1)
Sodium: 140 mmol/L (ref 135–145)

## 2020-09-16 LAB — CBC
HCT: 45.7 % (ref 36.0–46.0)
Hemoglobin: 15.4 g/dL — ABNORMAL HIGH (ref 12.0–15.0)
MCH: 30.3 pg (ref 26.0–34.0)
MCHC: 33.7 g/dL (ref 30.0–36.0)
MCV: 89.8 fL (ref 80.0–100.0)
Platelets: 379 10*3/uL (ref 150–400)
RBC: 5.09 MIL/uL (ref 3.87–5.11)
RDW: 12.4 % (ref 11.5–15.5)
WBC: 7.1 10*3/uL (ref 4.0–10.5)
nRBC: 0 % (ref 0.0–0.2)

## 2020-09-16 LAB — POC URINE PREG, ED: Preg Test, Ur: NEGATIVE

## 2020-09-16 MED ORDER — SULFAMETHOXAZOLE-TRIMETHOPRIM 800-160 MG PO TABS: 1.0000 | ORAL_TABLET | Freq: Two times a day (BID) | ORAL | 0 refills | Status: DC

## 2020-09-16 NOTE — ED Triage Notes (Signed)
Pt comes pov with left sided pelvic pain that happened suddenly after sneezing today. "felt like something exploded" pt states.

## 2020-09-16 NOTE — ED Provider Notes (Signed)
[facility] Emergency Department Provider Note  ____________________________________________   Event Date/Time   First MD Initiated Contact with Patient 09/16/20 1542     (approximate)  I have reviewed the triage vital signs and the nursing notes.   HISTORY  Chief Complaint Pelvic Pain    HPI Kelly Lopez is a 39 y.o. female presents emergency department complaining of left-sided pelvic pain that started suddenly this morning after sneezing.  She states felt like something exploded.  States she had some discomfort bilaterally for a few days.  Now has more severe pain on the left side.  No fever chills.  No vomiting or diarrhea.    Past Medical History:  Diagnosis Date  . Anxiety   . Depression   . Esophageal dysphagia   . Psoriasis     Patient Active Problem List   Diagnosis Date Noted  . Infertility counseling 06/12/2020  . History of fetal loss 09/08/2019  . History of retained placenta 08/31/2019  . Obesity 07/11/2019  . Anxiety and depression 07/11/2019  . History of miscarriage, currently pregnant, first trimester 06/19/2019  . 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 DILATION AND EVACUATION N/A 08/31/2019   Procedure: DILATATION AND EVACUATION;  Surgeon: Tereso Newcomer, MD;  Location: [facility] LD ORS;  Service: Gynecology;  Laterality: N/A;  . NO PAST SURGERIES      Prior to Admission medications   Medication Sig Start Date End Date Taking? Authorizing Provider  sulfamethoxazole-trimethoprim (BACTRIM DS) 800-160 MG tablet Take 1 tablet by mouth 2 (two) times daily. 09/16/20  Yes Jaunice Mirza, Roselyn Bering, PA-C  diphenhydrAMINE (BENADRYL) 25 mg capsule Take 25 mg by mouth at bedtime as needed for sleep.    [provider]  docusate sodium (COLACE) 100 MG capsule Take 100 mg by mouth 2 (two) times daily.    [provider]  loratadine (CLARITIN) 10 MG tablet Take 10 mg  by mouth daily as needed for allergies.    [provider]  omeprazole (PRILOSEC) 20 MG capsule Take 20 mg by mouth daily.    [provider]  prenatal vitamin w/FE, FA (PRENATAL 1 + 1) 27-1 MG TABS tablet Take 1 tablet by mouth daily at 12 noon.    [provider]  venlafaxine XR (EFFEXOR-XR) 150 MG 24 hr capsule Take 150 mg by mouth at bedtime.     [provider]    Allergies Sertraline hcl  History reviewed. No pertinent family history.  Social History Social History   Tobacco Use  . Smoking status: Never Smoker  . Smokeless tobacco: Never Used  Vaping Use  . Vaping Use: Never used  Substance Use Topics  . Alcohol use: Not Currently  . Drug use: Not Currently    Review of Systems  Constitutional: No fever/chills Eyes: No visual changes. ENT: No sore throat. Respiratory: Denies cough Cardiovascular: Denies chest pain Gastrointestinal: Complains of left-sided abdominal pain Genitourinary: Negative for dysuria. Musculoskeletal: Negative for back pain. Skin: Negative for rash. Psychiatric: no mood changes,     ____________________________________________   PHYSICAL EXAM:  VITAL SIGNS: ED Triage Vitals  Enc Vitals Group     BP 09/16/20 1326 122/80     Pulse Rate 09/16/20 1326 90     Resp 09/16/20 1326 18     Temp 09/16/20 1329 97.7 F (36.5 C)     Temp Source 09/16/20 1329 Oral     SpO2 09/16/20  1326 97 %     Weight 09/16/20 1327 200 lb (90.7 kg)     Height 09/16/20 1327 5\' 4"  (1.626 m)     Head Circumference --      Peak Flow --      Pain Score 09/16/20 1327 4     Pain Loc --      Pain Edu? --      Excl. in GC? --     Constitutional: Alert and oriented. Well appearing and in no acute distress. Eyes: Conjunctivae are normal.  Head: Atraumatic. Nose: No congestion/rhinnorhea. Mouth/Throat: Mucous membranes are moist.   Neck:  supple no lymphadenopathy noted Cardiovascular: Normal rate, regular rhythm. Heart sounds  are normal Respiratory: Normal respiratory effort.  No retractions, lungs c t a  Abd: soft tender in the left lower quadrant, some rebound tenderness noted when pressed on the right side and it radiates to the left, bs normal all 4 quad GU: deferred Musculoskeletal: FROM all extremities, warm and well perfused Neurologic:  Normal speech and language.  Skin:  Skin is warm, dry and intact. No rash noted. Psychiatric: Mood and affect are normal. Speech and behavior are normal.  ____________________________________________   LABS (all labs ordered are listed, but only abnormal results are displayed)  Labs Reviewed  CBC - Abnormal; Notable for the following components:      Result Value   Hemoglobin 15.4 (*)    All other components within normal limits  BASIC METABOLIC PANEL - Abnormal; Notable for the following components:   Glucose, Bld 101 (*)    All other components within normal limits  URINALYSIS, COMPLETE (UACMP) WITH MICROSCOPIC - Abnormal; Notable for the following components:   Color, Urine COLORLESS (*)    APPearance TURBID (*)    WBC, UA >50 (*)    Bacteria, UA MANY (*)    All other components within normal limits  URINE CULTURE  POC URINE PREG, ED   ____________________________________________   ____________________________________________  RADIOLOGY  Ultrasound of the pelvis and rule out torsion  ____________________________________________   PROCEDURES  Procedure(s) performed: No  Procedures    ____________________________________________   INITIAL IMPRESSION / ASSESSMENT AND PLAN / ED COURSE  Pertinent labs & imaging results that were available during my care of the patient were reviewed by me and considered in my medical decision making (see chart for details).   Patient is 39 year old female presents with left lower quadrant pain.  See HPI.  Physical exam shows patient be tender left lower quadrant.  DDx: Diverticulitis, ovarian cyst,  hemorrhagic cyst, ectopic pregnancy  CBC is normal, basic metabolic panel is normal, POC pregnancy is negative, Ultrasound of the pelvis with transvaginal intorsion rule out   Ultrasound is negative  UA did show a large amount of WBCs and white blood cell clumps, many bacteria noted  I did discuss findings with patient. Feel this is more of a UTI. Due to her ultrasound being negative I feel confident that she will be able to be discharged. She was given a prescription for Septra DS 1 twice daily for 7 days. Urine culture was added to ensure proper antibiotic. She was discharged in stable condition with strict instructions to return if worsening.  Nur Rabold Chivers was evaluated in Emergency Department on 09/16/2020 for the symptoms described in the history of present illness. She was evaluated in the context of the global COVID-19 pandemic, which necessitated consideration that the patient might be at risk for infection with the SARS-CoV-2 virus that  causes COVID-19. Institutional protocols and algorithms that pertain to the evaluation of patients at risk for COVID-19 are in a state of rapid change based on information released by regulatory bodies including the CDC and federal and state organizations. These policies and algorithms were followed during the patient's care in the ED.    As part of my medical decision making, I reviewed the following data within the electronic MEDICAL RECORD NUMBER Nursing notes reviewed and incorporated, Labs reviewed, Old chart reviewed, Radiograph reviewed , Notes from prior ED visits and [place] Controlled Substance Database  ____________________________________________   FINAL CLINICAL IMPRESSION(S) / ED DIAGNOSES  Final diagnoses:  LLQ pain  Acute UTI      NEW MEDICATIONS STARTED DURING THIS VISIT:  New Prescriptions   SULFAMETHOXAZOLE-TRIMETHOPRIM (BACTRIM DS) 800-160 MG TABLET    Take 1 tablet by mouth 2 (two) times daily.     Note:  This document was  prepared using Dragon voice recognition software and may include unintentional dictation errors.    Faythe Ghee, PA-C 09/16/20 1811    Gilles Chiquito, MD 09/16/20 Windell Moment

## 2020-09-16 NOTE — Discharge Instructions (Addendum)
Follow-up with your regular doctor if not improving in 2 to 3 days.  Return emergency department worsening.  Take your antibiotic as prescribed.  We did order a urine culture.  If you are not on the correct antibiotic a nurse will call you and call in a different medicine.

## 2020-09-18 LAB — URINE CULTURE

## 2020-09-30 ENCOUNTER — Ambulatory Visit: Payer: No Typology Code available for payment source | Admitting: Obstetrics and Gynecology

## 2020-10-01 IMAGING — US US MFM AMNIOCENTESIS
1 series · 16 of 28 positions shown · non-contrast
Comparison: none

[Series 1: us mfm amniocentesis · 74 acquisitions, 16 frames shown]
[im 1/74]
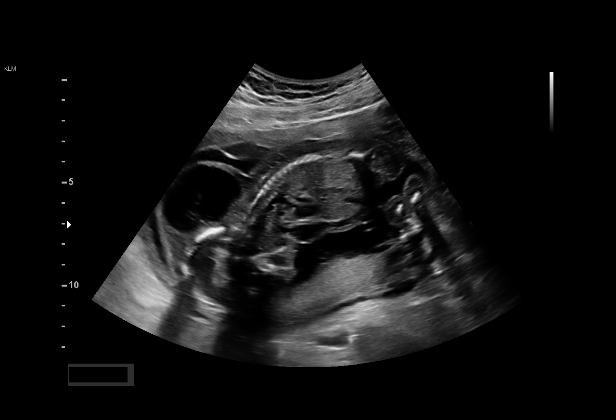
[im 6/74]
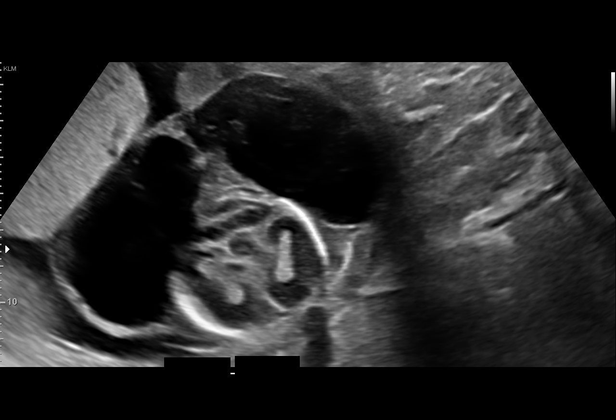
[im 11/74]
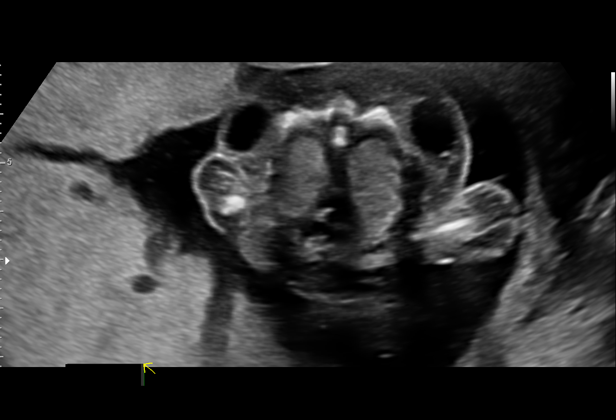
[im 17/74]
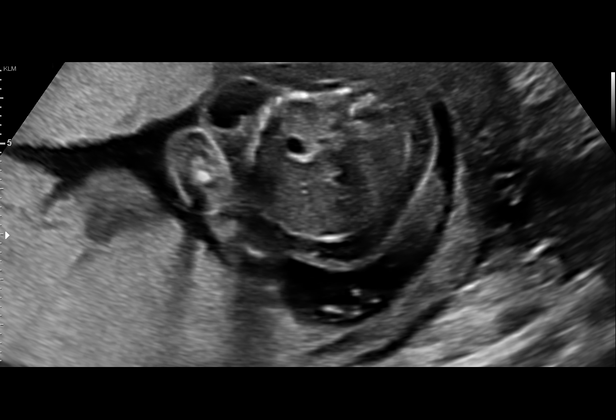
[im 19/74]
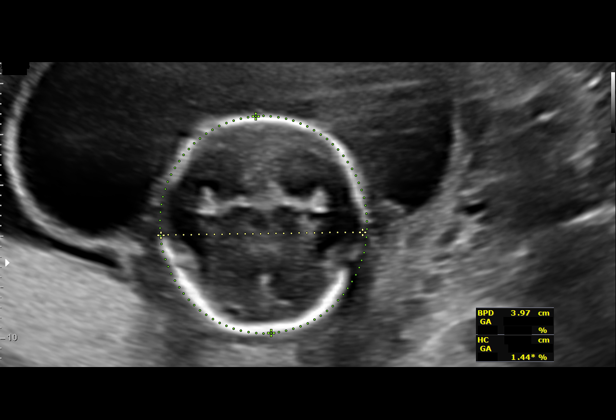
[im 25/74]
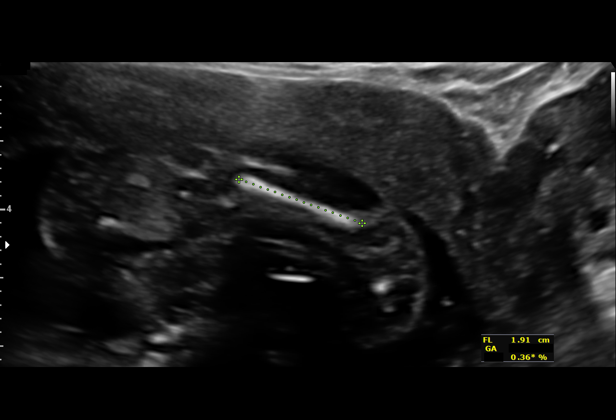
[im 30/74]
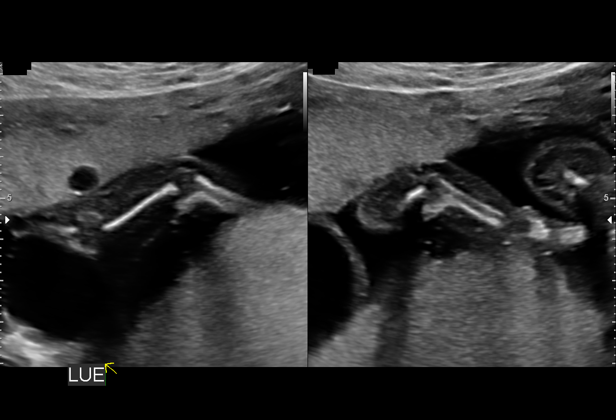
[im 36/74]
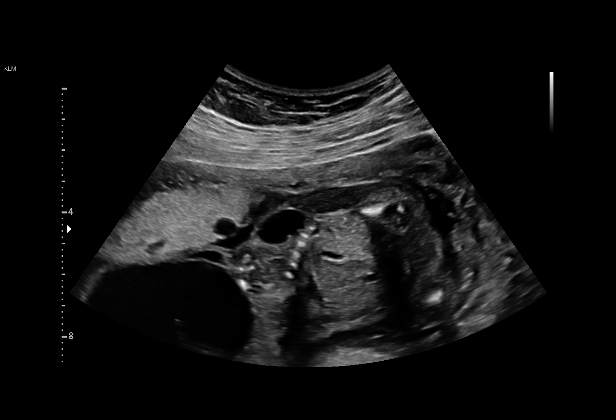
[im 38/74]
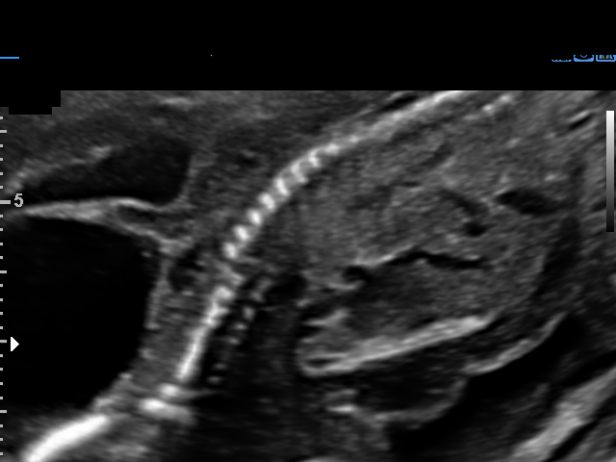
[im 44/74]
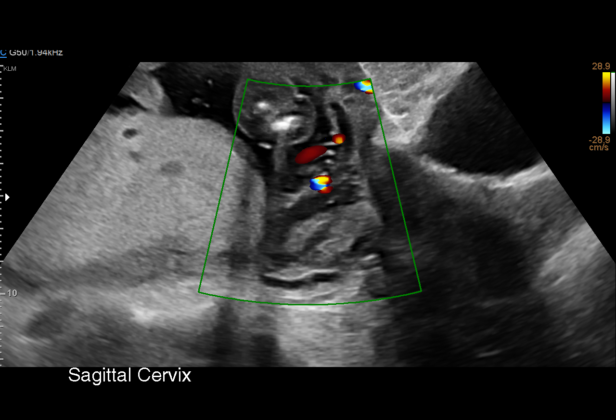
[im 49/74]
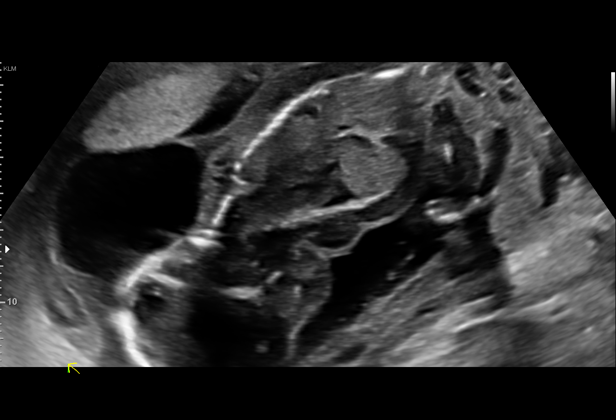
[im 55/74]
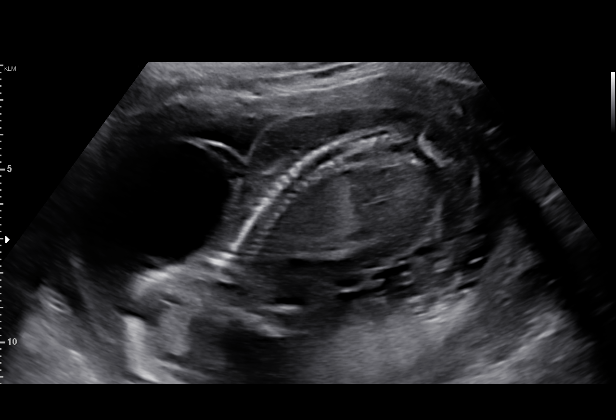
[im 57/74]
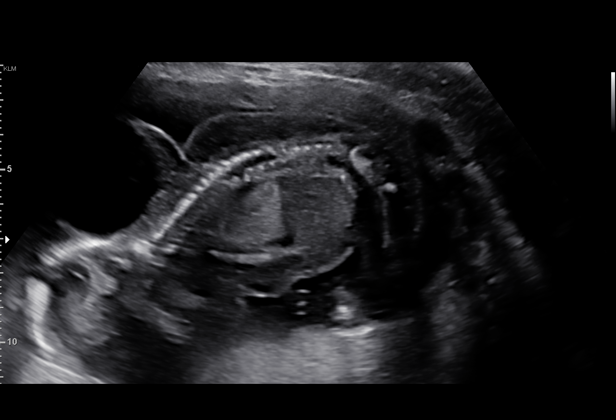
[im 63/74]
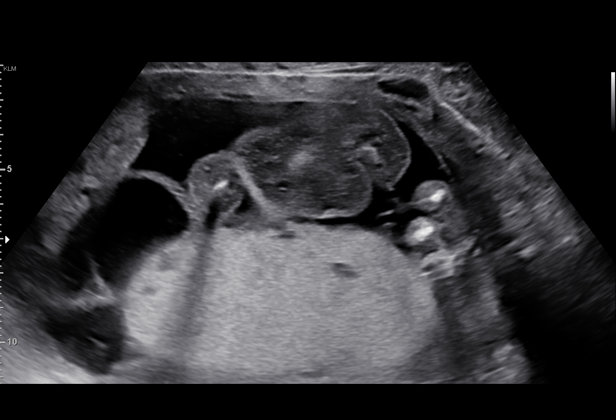
[im 68/74]
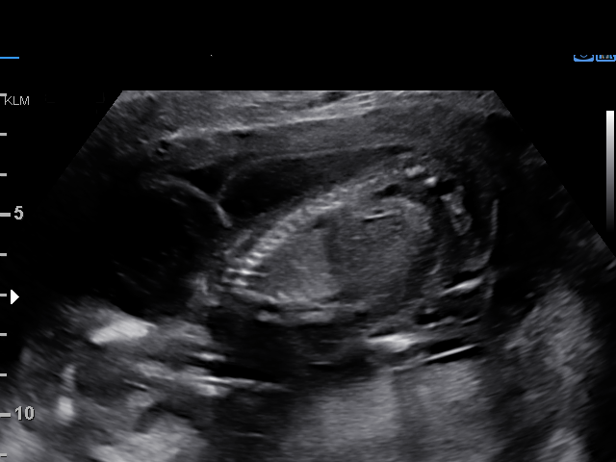
[im 74/74]
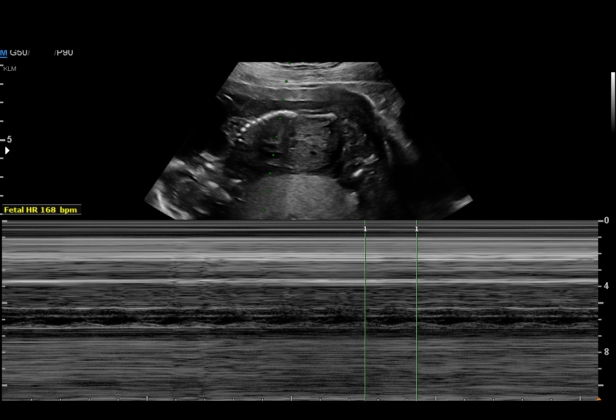

[16 of 28 positions shown; findings below may reference images not displayed]

[address]

  1  US MFM AMNIOCENTESIS                 76946.01     BOND ROBILLARD
 ----------------------------------------------------------------------

 ----------------------------------------------------------------------
Indications

  Cystic hygroma
  Advanced maternal age multigravida 35+,
  second trimester
  18 weeks gestation of pregnancy
  Encounter for antenatal screening for
  malformations (low risk)
 ----------------------------------------------------------------------
Fetal Evaluation

 Num Of Fetuses:         1
 Fetal Heart Rate(bpm):  150
 Cardiac Activity:       Observed
 Presentation:           Breech
 Placenta:               Posterior Previa, succenturiate lobe
 P. Cord Insertion:      Marginal insertion

 Amniotic Fluid
 AFI FV:      Subjectively low-normal

                             Largest Pocket(cm)

Biometry

 BPD:      39.7  mm     G. Age:  18w 0d         47  %    CI:        89.59   %    70 - 86
                                                         FL/HC:      14.1   %    15.8 - 18
 HC:      131.9  mm     G. Age:  16w 5d          2  %    HC/AC:      0.85        1.07 -
 AC:      155.5  mm     G. Age:  20w 5d         99  %    FL/BPD:     46.9   %
 FL:       18.6  mm     G. Age:  15w 4d        < 1  %    FL/AC:      12.0   %    20 - 24
 HUM:      19.5  mm     G. Age:  15w 5d        < 5  %

 Est. FW:     225  gm      0 lb 8 oz     44  %
OB History

 Gravidity:    3         Term:   0        Prem:   0        SAB:   1
 TOP:          1       Ectopic:  0        Living: 0
Gestational Age

 U/S Today:     17w 5d                                        EDD:   01/13/20
 Best:          18w 1d     Det. By:  Early Ultrasound         EDD:   01/10/20
                                     (06/19/19)
Anatomy

 Cranium:               Skin edema             Aortic Arch:            Not well visualized
 Cavum:                 Not well visualized    Ductal Arch:            Not well visualized
 Ventricles:            Not well visualized    Diaphragm:              Not well visualized
 Choroid Plexus:        Appears normal         Stomach:                Appears normal, left
                                                                       sided
 Cerebellum:            Not well visualized    Abdomen:                Abnormal, see
                                                                       comments
 Posterior Fossa:       Not well visualized    Abdominal Wall:         Not well visualized
 Nuchal Fold:           Cystic hygroma         Cord Vessels:           Not well visualized
 Face:                  Not well visualized    Kidneys:                Not well visualized
 Lips:                  Not well visualized    Bladder:                Appears normal
 Thoracic:              Appears normal         Spine:                  Appears normal
 Heart:                 Not well visualized    Upper Extremities:      Visualized
 RVOT:                  Not well visualized    Lower Extremities:      Visualized
 LVOT:                  Not well visualized

 Other:  Technically difficult due to fetal position.
Guided Procedures

 Type:   Amniocentesis

 FH Post Procedure:     Normal             RH Type:          A+
 Rh Immune Globulin:    Not required,      Discharge Inst.:  Post-procedure
                        Rh positive                          instructions
                                                             given
 Needle Insertions:     20 gauge x 1       Vol. Withdrawn:   30 ml of clear
                                                             amniotic fluid
 Transabdominal:        Yes

 Complications:  None

 Comment:                    Informed consent was obtained. A "time-out" was performed before the procedure. Patient
                             tolerated the procedure well. We gave her post-procedure instructions.
Cervix Uterus Adnexa

 Cervix
 Length:            4.2  cm.
 Normal appearance by transabdominal scan.
Comments

 This patient was seen for a detailed ultrasound as a cystic
 hygroma was noted during her first trimester ultrasound
 performed at [REDACTED].  The patient reports that this is
 her third pregnancy.  She had a prior elective termination of
 pregnancy and a first trimester miscarriage with her prior 2
 pregnancies.  She denies any significant past medical history
 and denies any problems in her current pregnancy.  She
 reports that she has not received any counseling regarding
 the significance of the cystic hygroma.

 She had a cell free DNA test which indicated a low risk for
 trisomy 21, 18, and 13.  A female fetus is predicted.

 On today's exam, a large cystic hygroma along with fetal
 hydrops was noted.  The fetal growth and amniotic fluid level
 appeared appropriate for her gestational age.

 The patient was advised that a cystic hygroma may be seen
 in fetuses with a chromosomal abnormality or a congenital
 heart defect.  It can also be seen in fetuses without a
 chromosomal abnormality.  She was advised that some cystic
 hygromas that are noted in the first trimester may resolve in
 the second trimester.  The prognosis for a successful
 pregnancy if a cystic hygroma is seen with fetal hydrops in
 the second trimester is usually poor.

 As fetal hydrops was noted today along with the large cystic
 hygroma, the patient was advised that she will most likely
 have a fetal demise within the next few weeks.

 Due to the increased risk of a chromosomal abnormality with
 a cystic hygroma, the patient was advised that an
 amniocentesis is recommended for definitive diagnosis of
 fetal aneuploidy.  After a complete discussion of the risks
 (including a miscarriage rate of 1 in 300) and benefits of the
 procedure, the patient consented to have the amniocentesis
 performed today.

 An uncomplicated genetic amniocentesis was performed
 today obtaining 30 cc of straw-colored amniotic fluid which
 was sent off for amniotic fluid AFP, FISH, and chromosome
 analysis with reflex MicroArray testing.  The patient was
 advised that our genetic counselor will notify her regarding
 the results of the amniocentesis.  Post amniocentesis
 instructions were discussed.  As the patient's blood type is
 Rh positive, a dose of RhoGam was not given following the
 procedure.

 The option for termination of pregnancy due to the poor
 prognosis was also discussed today.  The patient was
 advised that she should let us know sometime next week
 regarding her decision.  The patient will await the FISH
 results before making a final decision.

 Should the patient continue with her pregnancy, we will
 continue to follow her with serial ultrasounds.  A fetal
 echocardiogram will also be ordered later in her pregnancy.

 A total of 30 minutes was spent counseling and coordinating
 the care for this patient.  Greater than 50% of the time was
 spent in direct face-to-face contact.

## 2021-09-20 NOTE — L&D Delivery Note (Signed)
Delivery Note At 1642 a nonviable female infant was delivered via SVD. APGAR: 0, 0; weight pending.    Placenta status: injected with 20 units of Pitocin. Spontaneously delivered at 1724 intact. Fundus firm with massage.   Anesthesia: epidural Lacerations: none Est. Blood Loss (mL): 50 Placenta to path Complications none  Mom to postpartum. Pt viewed baby.    Donette Larry, CNM 03/01/2022 5:05 PM

## 2021-11-08 IMAGING — US US PELVIS COMPLETE TRANSABD/TRANSVAG W DUPLEX
1 series · 13 of 25 positions shown · non-contrast
Comparison: None.

CLINICAL DATA: Left lower quadrant pain for 1-1/2 weeks, worse
today.

EXAM:
TRANSABDOMINAL AND TRANSVAGINAL ULTRASOUND OF PELVIS
DOPPLER ULTRASOUND OF OVARIES
TECHNIQUE: Both transabdominal and transvaginal ultrasound examinations of the
pelvis were performed. Transabdominal technique was performed for
global imaging of the pelvis including uterus, ovaries, adnexal
regions, and pelvic cul-de-sac.
It was necessary to proceed with endovaginal exam following the
transabdominal exam to visualize the endometrium, ovaries and
adnexa. Color and duplex Doppler ultrasound was utilized to evaluate
blood flow to the ovaries.

[Series 1: us pelvic complete w transvaginal and torsion righ · 13 of 86 slices shown]
[im 1/86]
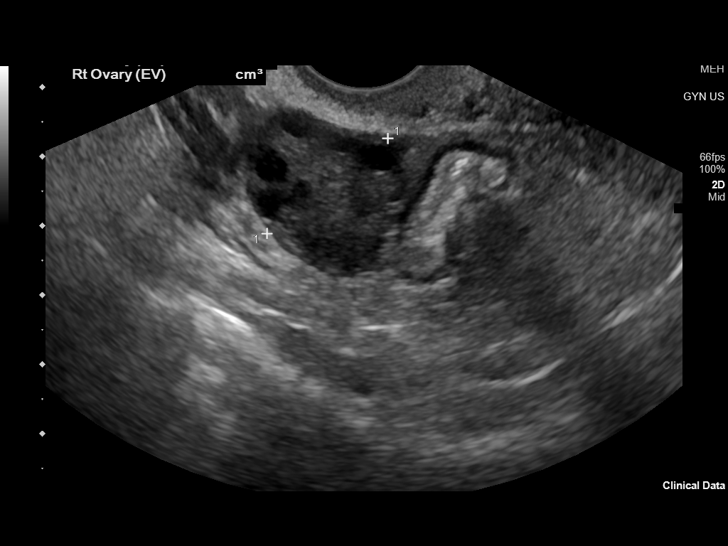
[im 8/86]
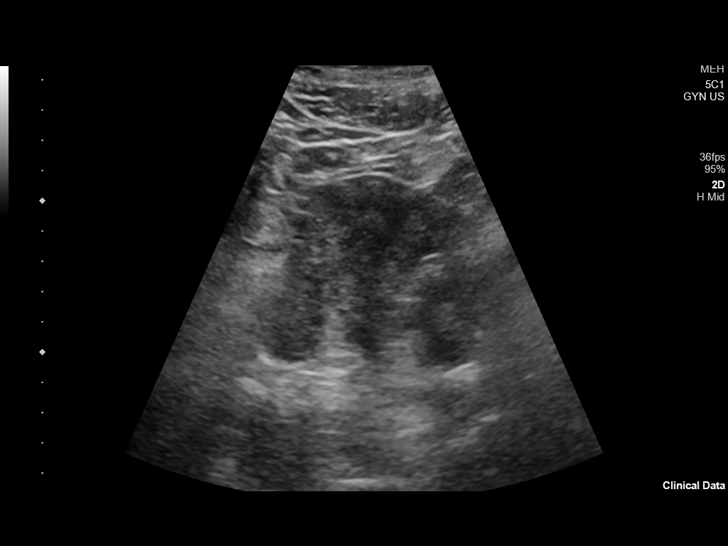
[im 15/86]
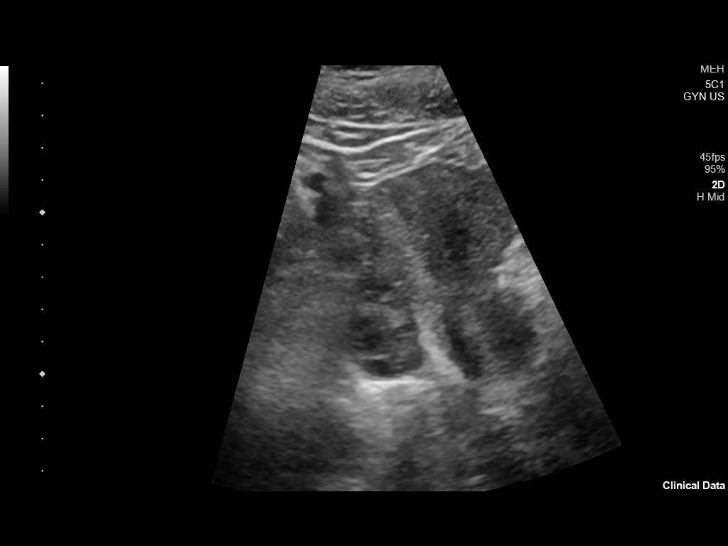
[im 22/86]
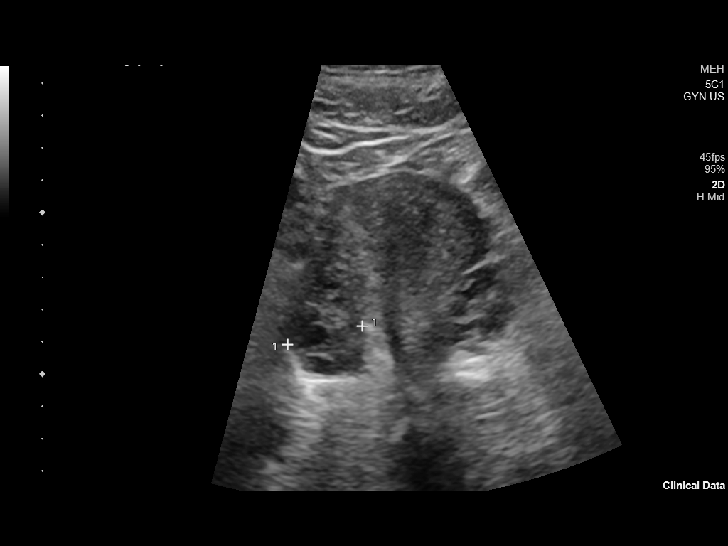
[im 29/86]
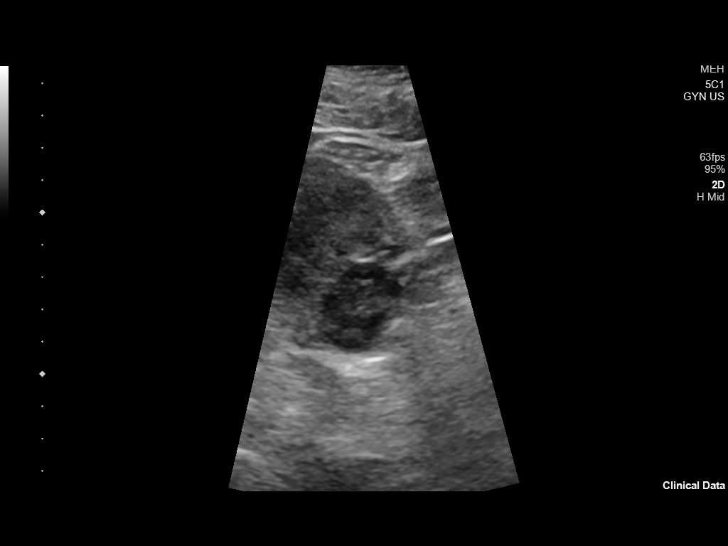
[im 36/86]
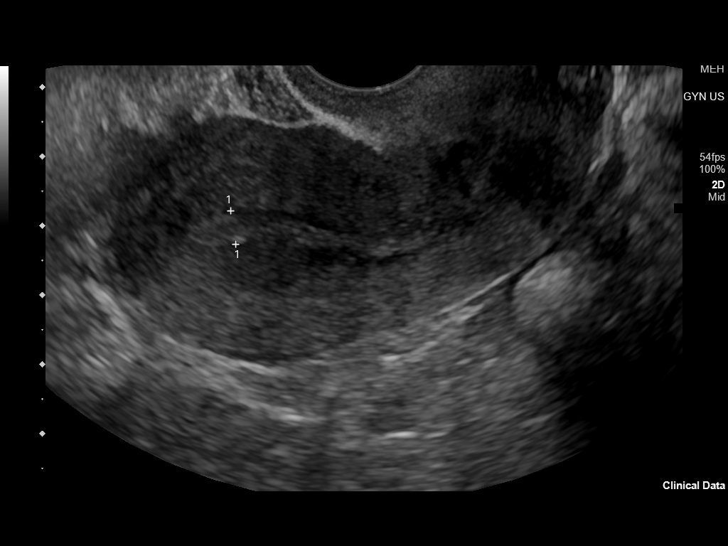
[im 43/86]
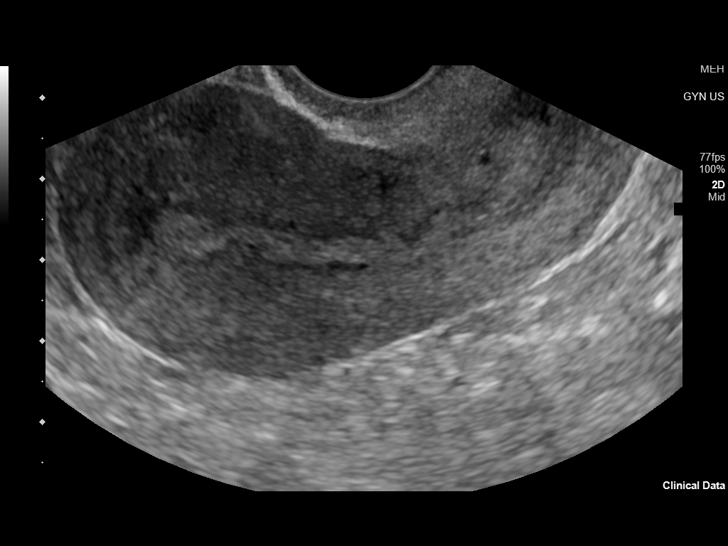
[im 50/86]
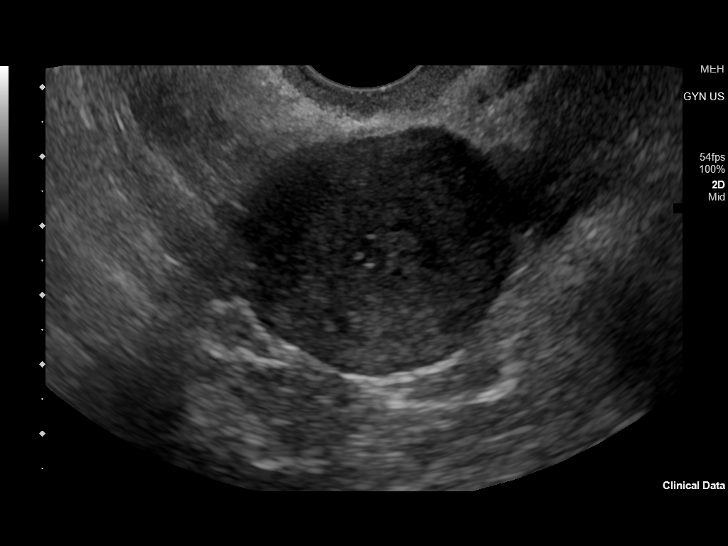
[im 57/86]
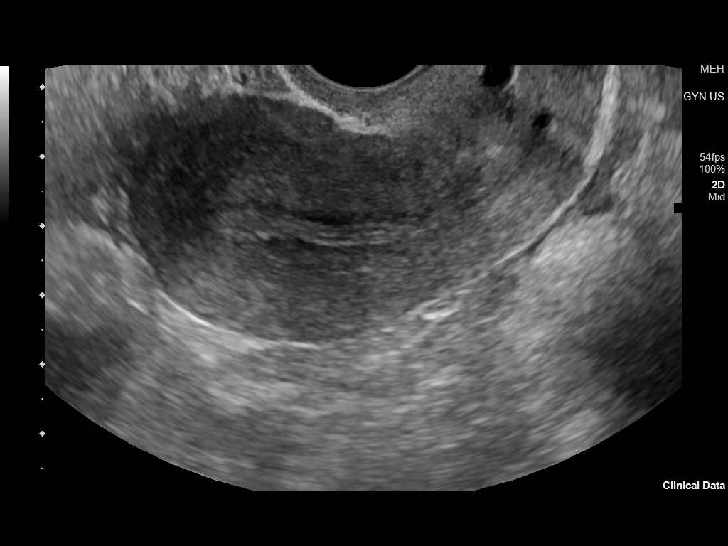
[im 64/86]
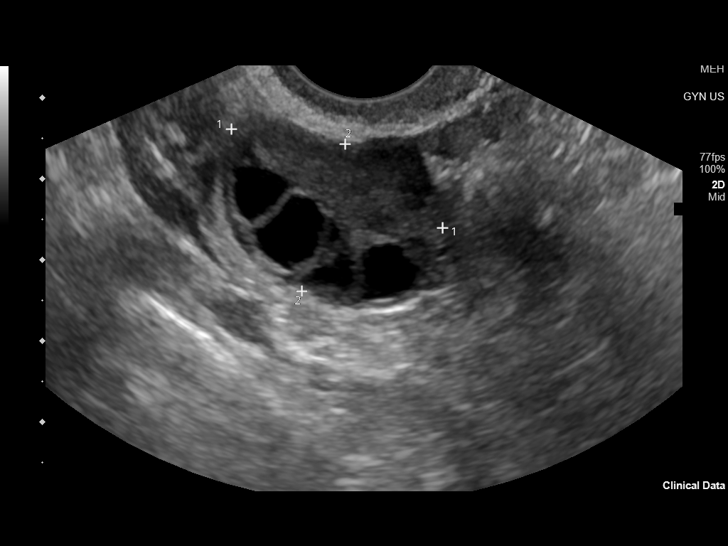
[im 71/86]
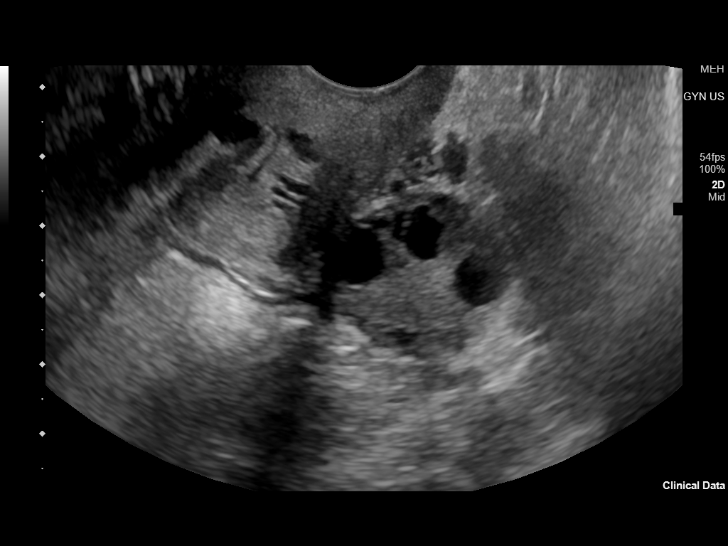
[im 78/86]
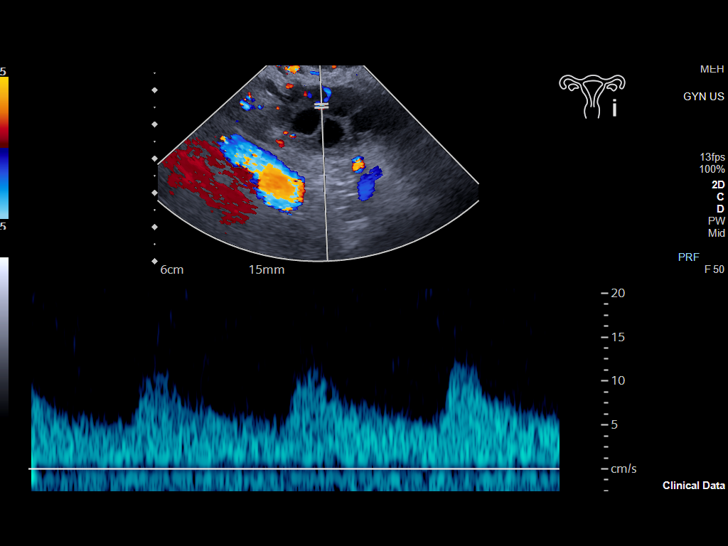
[im 86/86]
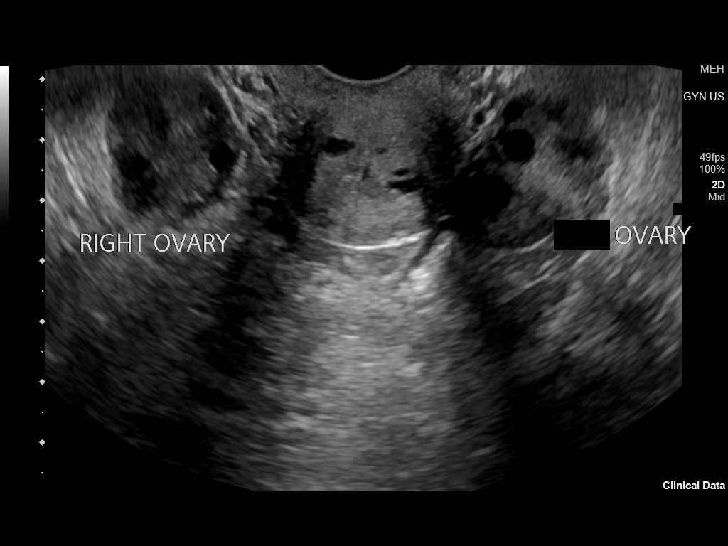

[13 of 25 positions shown; findings below may reference images not displayed]

FINDINGS: Uterus

Measurements: 7.7 x 3.7 x 3.6 cm = volume: 55 mL. Uterus is
anteverted. No fibroids or other mass visualized. Small nabothian
cysts in the cervix.

Endometrium

Thickness: 4-5 mm, normal.  No focal abnormality visualized.

Right ovary

Measurements: 2.9 x 1.9 x 2.2 cm = volume: 6.3 mL. Physiologic
follicles with normal blood flow. No cyst or solid mass. No adnexal
mass.

Left ovary

Measurements: 3.6 x 2.4 x 2.5 cm = volume: 11 mL. Physiologic
follicles with normal blood flow. No cyst or solid mass. No adnexal
mass.

Pulsed Doppler evaluation of both ovaries demonstrates normal
low-resistance arterial and venous waveforms.

Other findings

No abnormal free fluid.
IMPRESSION: Normal pelvic ultrasound with Doppler. Normal sonographic appearance
of both ovaries with normal blood flow.

## 2022-02-02 ENCOUNTER — Other Ambulatory Visit: Payer: Self-pay | Admitting: Obstetrics

## 2022-02-02 DIAGNOSIS — Z363 Encounter for antenatal screening for malformations: Secondary | ICD-10-CM

## 2022-02-03 ENCOUNTER — Encounter: Payer: Self-pay | Admitting: *Deleted

## 2022-02-05 ENCOUNTER — Encounter: Payer: Self-pay | Admitting: *Deleted

## 2022-02-05 ENCOUNTER — Ambulatory Visit: Payer: No Typology Code available for payment source | Admitting: *Deleted

## 2022-02-05 ENCOUNTER — Ambulatory Visit: Payer: No Typology Code available for payment source | Attending: Obstetrics and Gynecology

## 2022-02-05 ENCOUNTER — Ambulatory Visit (HOSPITAL_BASED_OUTPATIENT_CLINIC_OR_DEPARTMENT_OTHER): Payer: No Typology Code available for payment source | Admitting: Obstetrics

## 2022-02-05 VITALS — BP 106/70 | HR 98

## 2022-02-05 DIAGNOSIS — Z363 Encounter for antenatal screening for malformations: Secondary | ICD-10-CM | POA: Insufficient documentation

## 2022-02-05 DIAGNOSIS — Z3A13 13 weeks gestation of pregnancy: Secondary | ICD-10-CM

## 2022-02-05 DIAGNOSIS — O09521 Supervision of elderly multigravida, first trimester: Secondary | ICD-10-CM | POA: Insufficient documentation

## 2022-02-05 DIAGNOSIS — O359XX0 Maternal care for (suspected) fetal abnormality and damage, unspecified, not applicable or unspecified: Secondary | ICD-10-CM

## 2022-02-05 DIAGNOSIS — O288 Other abnormal findings on antenatal screening of mother: Secondary | ICD-10-CM | POA: Diagnosis not present

## 2022-02-05 DIAGNOSIS — Z6830 Body mass index (BMI) 30.0-30.9, adult: Secondary | ICD-10-CM | POA: Diagnosis present

## 2022-02-05 DIAGNOSIS — O99211 Obesity complicating pregnancy, first trimester: Secondary | ICD-10-CM | POA: Diagnosis not present

## 2022-02-05 NOTE — Progress Notes (Signed)
MFM Note  Kelly Lopez was seen for a first trimester ultrasound.  She had an ultrasound performed about 3 weeks ago at [facility] that showed a possible enlarged fetal bladder.    She reports that she was seen by MFM at [facility] who told her that due to fluid that was noted in the fetal abdomen, the prognosis for this pregnancy was not going to be good and a termination of pregnancy was recommended.  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  She also reports a blighted ovum noted in the first trimester of her first pregnancy.  She reports that she had a cell free DNA test (Materni T21) drawn at [facility] which showed a low risk for trisomy 21, 7, and 13.  A female fetus is predicted.  The crown-rump length measured today is consistent with an [facility] of August 07, 2022.  Significant fetal abdominal ascites is noted on today's exam.  The fetal bladder did not appear to be enlarged.  The amnion also appears to be separated from the uterus/placenta.  The poor prognosis for her pregnancy due to today's ultrasound findings was discussed.  She understands that the pregnancy will most likely result in a fetal demise later in her pregnancy.  Management options including an elective termination of pregnancy versus continued expectant management were discussed today.  The patient would like to continue expectant management for now.  As she has had two pregnancies in a row that have been complicated by significant fetal anomalies, I will have her meet with our genetic counselor next week.  During that visit, as part of the work-up for fetal abdominal ascites and her consecutive fetal abnormalities, we will have her blood drawn to screen for:  -Irregular antibodies in her blood  -IgM, IgG titer levels for CMV, toxoplasmosis, and parvovirus  -Maternal and paternal karyotype.  This pregnancy was conceived with a new partner.  -Carrier  screening for metabolic diseases  As the patient states that she would like to have everything possible done to give her a good outcome, we will make a referral to the fetal treatment center at [facility] to determine if a pigtail shunt placement for drainage of the abdominal ascites is a treatment option.   She understands that a shunt placement may not be possible until at least 16 to 18 weeks of pregnancy.  She may have a fetal demise prior to that time.    A follow-up ultrasound was scheduled in our office in 2 weeks for fetal assessment.  The patient stated that all of her questions have been answered.  A total of 45 minutes was spent counseling and coordinating the care for this patient.  Greater than 50% of the time was spent in direct face-to-face contact.

## 2022-02-09 ENCOUNTER — Other Ambulatory Visit: Payer: Self-pay | Admitting: *Deleted

## 2022-02-09 DIAGNOSIS — O337XX0 Maternal care for disproportion due to other fetal deformities, not applicable or unspecified: Secondary | ICD-10-CM

## 2022-02-11 ENCOUNTER — Ambulatory Visit: Payer: No Typology Code available for payment source

## 2022-02-11 ENCOUNTER — Telehealth: Payer: Self-pay

## 2022-02-11 ENCOUNTER — Ambulatory Visit: Payer: No Typology Code available for payment source | Attending: Obstetrics and Gynecology

## 2022-02-11 ENCOUNTER — Other Ambulatory Visit: Payer: Self-pay | Admitting: Obstetrics

## 2022-02-11 ENCOUNTER — Ambulatory Visit: Payer: Self-pay | Admitting: Genetics

## 2022-02-11 DIAGNOSIS — O359XX0 Maternal care for (suspected) fetal abnormality and damage, unspecified, not applicable or unspecified: Secondary | ICD-10-CM

## 2022-02-11 DIAGNOSIS — O337XX0 Maternal care for disproportion due to other fetal deformities, not applicable or unspecified: Secondary | ICD-10-CM

## 2022-02-11 DIAGNOSIS — Z118 Encounter for screening for other infectious and parasitic diseases: Secondary | ICD-10-CM

## 2022-02-11 DIAGNOSIS — O283 Abnormal ultrasonic finding on antenatal screening of mother: Secondary | ICD-10-CM

## 2022-02-11 NOTE — Addendum Note (Signed)
Addended by: Teena Dunk on: 02/11/2022 10:02 AM   Modules accepted: Orders

## 2022-02-11 NOTE — Telephone Encounter (Signed)
Patient called to confirm that time of 1:45pm on Friday 02/19/22 was fine.

## 2022-02-11 NOTE — Telephone Encounter (Signed)
Patient called to confirm that

## 2022-02-12 LAB — PARVOVIRUS B19 ANTIBODY, IGG AND IGM
Parvovirus B19 IgG: 3.6 index — ABNORMAL HIGH (ref 0.0–0.8)
Parvovirus B19 IgM: 0.2 index (ref 0.0–0.8)

## 2022-02-12 LAB — CMV IGM: CMV IgM Ser EIA-aCnc: <30 AU/mL (ref 0.0–29.9)

## 2022-02-12 LAB — CMV ANTIBODY, IGG (EIA): CMV Ab - IgG: 4 U/mL — ABNORMAL HIGH (ref 0.00–0.59)

## 2022-02-12 LAB — ABO AND RH: Rh Factor: POSITIVE

## 2022-02-12 LAB — TOXOPLASMA GONDII ANTIBODY, IGG: Toxoplasma IgG Ratio: <3 IU/mL (ref 0.0–7.1)

## 2022-02-12 LAB — TOXOPLASMA GONDII ANTIBODY, IGM: Toxoplasma Antibody- IgM: <3 AU/mL (ref 0.0–7.9)

## 2022-02-12 LAB — INFECT DISEASE AB IGM REFLEX 1

## 2022-02-12 NOTE — Progress Notes (Addendum)
Name: Kelly Lopez Indication: Significant fetal abdominal ascites on ultrasound examination   DOB: 17-May-1981 Age: 41 y.o.   EDC: 08/07/2022 LMP: 10/31/2021 Referring Provider:  Woodroe Chen, MD  EGA: [redacted]w[redacted]d Genetic Counselor: Teena Dunk, MS, CGC  OB Hx: [id number] Date of Appointment: 02/11/2022  Accompanied by: Malissa Hippo Face to Face Time: 40 Minutes   Previous Testing Completed: CBC from 01/12/2022 reviewed. MCV within normal limits. It is unlikely that Shatia is a beta thalassemia carrier or an alpha thalassemia carrier of the double-gene deletion. Individuals with a normal MCV may be single-gene deletion carriers, but it is unlikely that the current pregnancy would be affected with alpha or beta thalassemia major. Hemoglobin fractionation cascade from 01/12/2022 reviewed. No abnormal hemoglobin bands were noted. Tamyra previously completed carrier screening for Cystic Fibrosis (CF) on 06/19/2019. She screened to not be a carrier for CF. A negative result on carrier screening reduces the likelihood of being a carrier, however, does not entirely rule out the possibility. Kelly Lopez previously completed Non-Invasive Prenatal Screening (NIPS) in this pregnancy on 01/12/2022. The result is low risk, consistent with a female fetus. This screening significantly reduces the risk that the current pregnancy has Down syndrome, Trisomy 35, Trisomy 49, and common sex chromosome conditions, however, the risk is not zero given the limitations of NIPS. Additionally, there are many genetic conditions that cannot be detected by NIPS.   Medical History:  This is Kelly Lopez's 4th pregnancy. She has had three losses, two of which were elective. 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 Reports she takes Effexor, omeprazole, prenatal vitamins, and colace. 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  Denies personal history of diabetes, high blood  pressure, thyroid conditions, and seizures. Denies bleeding, infections, and fevers in this pregnancy. Denies using tobacco or alcohol in this pregnancy.  Family History: A pedigree was created and scanned into Epic under the Media tab. Maternal ethnicity reported as White/Caucasian and paternal ethnicity reported as Leisure centre manager. Denies Ashkenazi Jewish ancestry. Denies Consanguinity.      Genetic Counseling:   Fetal Abdominal Ascites. [facility]. During that ultrasound, significant fetal abdominal ascites was visualized. [name] was counseled by Dr. Parke Poisson regarding the poor prognosis for the pregnancy due to the ultrasound findings. Today we reviewed that fetal ascites is abnormal fluid collection in the fetal peritoneal cavity. We discussed that when fetal ascites is visualized on ultrasound a detailed workup is typically recommended to determine the etiology. This workup can include maternal screening for viral infections, maternal screening for isoimmune antibodies, and testing for genetic syndromes. Specifically we discussed expanded carrier screening (see below) for Kelly Lopez and her partner, Kelly Lopez. We also discussed amniocentesis for prenatal diagnosis in great detail. Possible procedural difficulties and complications that can arise include maternal infection, cramping, bleeding, fluid leakage, and/or pregnancy loss. The risk for pregnancy loss with an amniocentesis is 1/500. Per the [employer]necologists (ACOG) Practice Bulletin 162, all pregnant women should be offered prenatal assessment for aneuploidy by diagnostic testing regardless of maternal age or other risk factors. Testing that could be ordered on an amniocentesis sample includes a fetal karyotype, fetal microarray, fetal whole exome, amniotic fluid AFP, and testing for fetal infections.   Expanded Carrier screening. We discussed that  expanded carrier screening (ECS) is a testing option available that evaluates for a wide range of genetic conditions. Some of these conditions are severe and  actionable, but also rare; others occur more commonly, but are less severe. We discussed that testing panels could include more than 500 autosomal recessive or X-linked genetic conditions. In the event that one partner was found to be a carrier for one or more conditions, carrier screening would be available for the other partner for those conditions. This could help determine whether the current and future pregnancies are at increased risk for a particular recessive or X-linked genetic condition. We reviewed the risks, benefits, and limitations of expanded carrier screening with Darcy.   Cannabis use during pregnancy. Research is ongoing to determine the effects of cannabis in pregnancy. Persistent use of cannabis may cause decreased utero-placental perfusion, intrauterine fetal growth restriction, low birth weight and body length, and a higher likelihood of admission into the neonatal intensive care unit after birth.     Patient Plan:  Jerzi opted to proceed with and had her blood drawn for the following today: Maternal serum infection screen: CMV IgG & IgM, Toxo IgG & IgM, Parvo IgG & IgM ABO type and screen Expanded Carrier Screen through [facility] (self-pay $250)  Levy originally planned to proceed with amniocentesis on 02/19/2022 in [facility]. Tequia ended up having fetal paracentesis performed at [facility] on 02/16/2022. Please see notes from [facility] for details.   Informed consent was obtained. All questions were answered.    Thank you for sharing in the care of Kelly Lopez with [place].  Please do not hesitate to contact us if you have any questions.  Teena Dunk, MS, [facility]

## 2022-02-19 ENCOUNTER — Ambulatory Visit: Payer: No Typology Code available for payment source

## 2022-02-19 ENCOUNTER — Other Ambulatory Visit: Payer: No Typology Code available for payment source

## 2022-02-19 ENCOUNTER — Other Ambulatory Visit: Payer: Self-pay

## 2022-02-22 ENCOUNTER — Ambulatory Visit: Payer: Self-pay

## 2022-02-23 ENCOUNTER — Telehealth: Payer: Self-pay | Admitting: Genetics

## 2022-02-23 NOTE — Telephone Encounter (Signed)
Hiedi completed Expanded Carrier Screening through the laboratory [facility]. Genetic counseling returned the results to [facility] [place] on 02/23/22. The results are scanned into Epic under the Media tab. The results are as follows:   Alpha-1 antitrypsin deficiency (AATD). Two variants were identified on Sharleen's expanded carrier screening: The Z allele: c.1096G>A (p.Glu366Lys), Pathogenic The S allele: c.863A>T (p.Glu288Val), Pathogenic (low penetrance) Based on the literature, these two variants frequently occur on opposite chromosomes. However, it is not possible to determine from this test whether the two SERPINA1 variants in [place] are located in the same copy of the gene or in different copies of the gene. Therefore, Isra is at least a carrier of, and may be affected with, a SERPINA1-related condition.  AATD can cause serious lung disease in adults, and serious liver disease in adults and more rarely in children. It most commonly affects those of the Caucasian population. To better understand why AATD causes health problems, it is often helpful to know the normal function of alpha-1 antitrysin. Alpha-1 antitrypsin is a protein that is produced by the liver. The normal function of alpha-1-antitrypsin is to stop an enzyme released by white blood cells. This enzyme destroys bacteria and aging of damaged cells, which is a normal and healthy function in the body's protection from infection and disease. However, if this enzyme remains unchecked it can damage healthy lung tissue. Thus, when there is not a sufficient amount of alpha-1 antitrypsin, this enzyme remains unchecked and causes lung damage. This is the case for individuals with AATD.  AATD is a variable condition with symptoms that can develop anytime from infancy through adulthood. The most common presentation of AATD involves development of lung disease between the ages of 52 and 50 years. Individuals with AATD are at increased risk to develop  chronic obstructive pulmonary disease (COPD), including a lung disease that makes it hard to breathe (emphysema) and/or chronic bronchitis. Initial symptoms typically include shortness of breath, wheezing, and repeated respiratory infections. Smoking or exposure to tobacco smoke hastens the development and worsens the symptoms of COPD. Additionally, both infants and adults have an increased risk to develop liver disease, including a reduced ability to drain bile from the liver (cholestasis), yellowing of the skin and eyes (jaundice), and damage or scarring to the liver (cirrhosis). Liver disease is the most common presenting symptom in affected infants. Individuals with AATD may also be at risk of developing a type of liver cancer called hepatocellular carcinoma. Rarely, individuals may develop painful skin lumps due to a skin disease called necrotizing panniculitis. Individuals with a single pathogenic variant for AATD are typically healthy, but may have a slightly increased risk for lung or liver diseases. Environmental factors, such as smoking, increase the risk of lung disease. We recommend that Cheron share this result with her Primary Care Physician for appropriate follow up and management of symptoms.  We offered carrier screening for Tamiah's reproductive partner Chales Abrahams) for AATD. If Frieda is only a carrier of AATD (two variants located in the same copy of the gene) and Chales Abrahams screens to also be a carrier we would quote a 25% chance for each of their offspring to have the condition. If Fumiko is affected with AATD (two variants located in different copies of the gene) and Chales Abrahams screens to also be a carrier we would quote a 50% chance for each of their offspring to have the condition. If Chales Abrahams would like to complete carrier screening in the future this can be ordered by genetic counseling in the  [facility].   MVK-related conditions. 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 The MVK gene is associated with multiple conditions that can have both distinct and overlapping symptoms, as well as different inheritance patterns. MVK-related conditions include autosomal recessive mevalonate kinase deficiency and autosomal dominant porokeratosis. The MVK variant identified in [place] is expected to be associated with autosomal recessive mevalonate kinase deficiency. This variant has not been reported to confer risk for autosomal dominant MVK-related conditions.  Mevalonate kinase deficiency is a condition that causes problems with the immune system. It can present with a range in severity from the milder hyperimmunoglobulin D (hyper-IgD) syndrome (HIDS) to severe mevalonic aciduria. Individuals with hyper-IgD have a periodic fever syndrome characterized by attacks of idiopathic fever accompanied by chills, joint pain, headache, vomiting, diarrhea, and swollen or enlarged lymph nodes (lymphadenopathy) with increased numbers of white blood cells (leukocytosis). Attacks typically occur once every 1-2 months, and last up to a week. Most affected individuals present in the first year of life, however, symptoms and age of onset can vary. Mevalonic aciduria is characterized by varying degrees of developmental delays, low muscle tone (hypotonia), difficulty with balance, coordination, and eye movements (cerebellar ataxia), clouding of the lens of the eye (cataracts), muscle weakness (myopathy), and unusual facial features (dysmorphism), as well as recurrent fevers with enlarged liver (hepatomegaly), lymphadenopathy, joint pain, and rash. While the earliest affected individuals described typically exhibited severe symptoms in infancy, milder presentations have also been reported.  We offered carrier screening for Willella's reproductive partner Chales Abrahams) for MVK-related conditions. If Chales Abrahams screens to also be a carrier we would quote a 25% chance for each of their  offspring to have the condition. If Chales Abrahams would like to complete carrier screening in the future this can be ordered by genetic counseling in the [facility].   Other Results to Note:  Two pseudodeficiency alleles were identified on Kaysen's screening: Benign change, c.2065G>A (p.Glu689Lys) identified in the GAA gene. Benign change, c.1685T>C (p.Ile562Thr) identified in the GALC gene. We reviewed that pseudodeficiency alleles are not known to be associated with disease. Individuals that have pseudodeficiency alleles may exhibit false positive results on related biochemical tests, including newborn screening. Carrier testing for Chales Abrahams is not indicated based on these results.  A benign variant c.5603A>T (p.Asn1868Ile) was identified in the ABCA4 gene. This benign variant is not known to cause disease and does not impact Laikyn's risk to be a carrier for ABCA4-related conditions. This variant may modify disease severity and/or age of onset when it is present in combination with additional known pathogenic variants. Carrier testing for Chales Abrahams is not indicated based on this result.

## 2022-02-23 NOTE — Telephone Encounter (Signed)
Called Kelly Lopez to return [facility] Expanded Carrier Screening results. Left voicemail with [facility] call back number.

## 2022-02-24 ENCOUNTER — Encounter: Payer: Self-pay | Admitting: Obstetrics

## 2022-02-24 NOTE — Progress Notes (Signed)
MFM Note  Kelly Lopez is a 41 year old gravida 4 para 0 currently at 16 weeks and 4 days.  Her current pregnancy has been complicated by a fetus with abdominal ascites most likely due to a ruptured urinary bladder.    The patient had an ultrasound performed in the first trimester at [facility] that showed an enlarged fetal bladder.    During an ultrasound performed in our office 2 weeks ago, significant abdominal ascites was noted.   She was referred to the fetal treatment center at [facility] where Dr. Leward Quan performed a paracentesis to drain out the ascitic fluid. The analysis of the ascitic fluid showed that the fluid is most likely from a urinary source, indicating that a bladder rupture has most likely occurred.    The significant abdominal ascites returned a few days after the paracentesis was performed.    The MicroArray analysis from the amniocentesis found a deletion of undetermined significance.  Due to the poor prognosis for her pregnancy due to the recurrent significant abdominal ascites, the increased risk of an IUFD, and as there are limited treatments available for a ruptured bladder, the option for a termination of pregnancy was discussed with the patient at [facility].  The patient was advised that this abnormality is most likely a life limiting abnormality for the fetus.  After considering all management options, the patient has decided to undergo a termination of pregnancy.    As the patient underwent an induction for termination of pregnancy due to a fetus with Turner syndrome at [facility] 2 years ago, she would like the induction procedure to be scheduled at [facility] again.  She will come to our office later this afternoon to sign the [place] 72-hour form for termination pregnancy.    I will help the patient make arrangements to schedule an induction of labor sometime next week.

## 2022-02-25 ENCOUNTER — Telehealth: Payer: Self-pay | Admitting: Family Medicine

## 2022-02-25 NOTE — Telephone Encounter (Signed)
-----   Message from [facility] Kelly Rings, MD sent at 02/24/2022  1:49 PM EDT ----- Regarding: Schedule induction for TOP next week Hi Kelly Lopez  Here is the patient we spoke about yesterday for termination of pregnancy due to significant recurrent abdominal ascites.    Dr. Barbarann Ehlers at [facility] believes that the abdominal ascites is due to a ruptured bladder.    I have a detailed note written in her chart in Epic.    The patient is coming to our office this afternoon to sign the 72-hour form.    She would like the induction to be scheduled for her sometime next week (as her husband has the kids from a previous relationship this week).    She said she would be available Sunday night if that time is available.    Thank you  Alecia Lemming

## 2022-02-28 ENCOUNTER — Encounter (HOSPITAL_COMMUNITY): Payer: Self-pay | Admitting: Family Medicine

## 2022-02-28 ENCOUNTER — Inpatient Hospital Stay (HOSPITAL_COMMUNITY): Payer: No Typology Code available for payment source | Admitting: Anesthesiology

## 2022-02-28 ENCOUNTER — Inpatient Hospital Stay (HOSPITAL_COMMUNITY)
Admission: AD | Admit: 2022-02-28 | Discharge: 2022-03-02 | DRG: 779 | Disposition: A | Discharge status: Home / Self Care | Payer: No Typology Code available for payment source | Attending: Obstetrics & Gynecology | Admitting: Obstetrics & Gynecology

## 2022-02-28 DIAGNOSIS — Z3A17 17 weeks gestation of pregnancy: Secondary | ICD-10-CM | POA: Diagnosis not present

## 2022-02-28 DIAGNOSIS — Z332 Encounter for elective termination of pregnancy: Secondary | ICD-10-CM | POA: Diagnosis present

## 2022-02-28 DIAGNOSIS — K219 Gastro-esophageal reflux disease without esophagitis: Secondary | ICD-10-CM | POA: Diagnosis present

## 2022-02-28 DIAGNOSIS — F419 Anxiety disorder, unspecified: Secondary | ICD-10-CM | POA: Diagnosis present

## 2022-02-28 DIAGNOSIS — O359XX0 Maternal care for (suspected) fetal abnormality and damage, unspecified, not applicable or unspecified: Secondary | ICD-10-CM | POA: Diagnosis present

## 2022-02-28 DIAGNOSIS — F32A Depression, unspecified: Secondary | ICD-10-CM | POA: Diagnosis present

## 2022-02-28 DIAGNOSIS — O99324 Drug use complicating childbirth: Secondary | ICD-10-CM | POA: Diagnosis not present

## 2022-02-28 DIAGNOSIS — O358XX0 Maternal care for other (suspected) fetal abnormality and damage, not applicable or unspecified: Secondary | ICD-10-CM | POA: Diagnosis present

## 2022-02-28 LAB — TYPE AND SCREEN
ABO/RH(D): A POS
Antibody Screen: NEGATIVE

## 2022-02-28 LAB — CBC
HCT: 35.2 % — ABNORMAL LOW (ref 36.0–46.0)
Hemoglobin: 12.2 g/dL (ref 12.0–15.0)
MCH: 30.5 pg (ref 26.0–34.0)
MCHC: 34.7 g/dL (ref 30.0–36.0)
MCV: 88 fL (ref 80.0–100.0)
Platelets: 295 10*3/uL (ref 150–400)
RBC: 4 MIL/uL (ref 3.87–5.11)
RDW: 12.9 % (ref 11.5–15.5)
WBC: 8.7 10*3/uL (ref 4.0–10.5)
nRBC: 0 % (ref 0.0–0.2)

## 2022-02-28 MED ORDER — LACTATED RINGERS IV SOLN: INTRAVENOUS | Status: DC

## 2022-02-28 MED ORDER — DIPHENHYDRAMINE HCL 50 MG/ML IJ SOLN
12.5000 mg | INTRAMUSCULAR | Status: AC | PRN
  Administered 2022-03-01 (×3): 12.5 mg via INTRAVENOUS
  Filled 2022-02-28: qty 1

## 2022-02-28 MED ORDER — LACTATED RINGERS IV SOLN
500.0000 mL | INTRAVENOUS | Status: DC | PRN
  Administered 2022-03-01: 500 mL via INTRAVENOUS

## 2022-02-28 MED ORDER — LIDOCAINE HCL (PF) 1 % IJ SOLN
INTRAMUSCULAR | Status: DC | PRN
  Administered 2022-02-28 (×2): 4 mL via EPIDURAL

## 2022-02-28 MED ORDER — OXYTOCIN-SODIUM CHLORIDE 30-0.9 UT/500ML-% IV SOLN: 2.5000 [IU]/h | INTRAVENOUS | Status: DC

## 2022-02-28 MED ORDER — MISOPROSTOL 200 MCG PO TABS
200.0000 ug | ORAL_TABLET | ORAL | Status: DC | PRN
  Administered 2022-02-28 – 2022-03-01 (×2): 200 ug via VAGINAL
  Filled 2022-02-28 (×2): qty 1

## 2022-02-28 MED ORDER — SOD CITRATE-CITRIC ACID 500-334 MG/5ML PO SOLN: 30.0000 mL | ORAL | Status: DC | PRN

## 2022-02-28 MED ORDER — OXYCODONE-ACETAMINOPHEN 5-325 MG PO TABS: 1.0000 | ORAL_TABLET | ORAL | Status: DC | PRN

## 2022-02-28 MED ORDER — EPHEDRINE 5 MG/ML INJ: 10.0000 mg | INTRAVENOUS | Status: DC | PRN

## 2022-02-28 MED ORDER — PHENYLEPHRINE 80 MCG/ML (10ML) SYRINGE FOR IV PUSH (FOR BLOOD PRESSURE SUPPORT): 80.0000 ug | PREFILLED_SYRINGE | INTRAVENOUS | Status: DC | PRN

## 2022-02-28 MED ORDER — MISOPROSTOL 200 MCG PO TABS
200.0000 ug | ORAL_TABLET | ORAL | Status: DC | PRN
  Administered 2022-02-28 – 2022-03-01 (×2): 200 ug via ORAL
  Filled 2022-02-28 (×2): qty 1

## 2022-02-28 MED ORDER — EPHEDRINE 5 MG/ML INJ
10.0000 mg | INTRAVENOUS | Status: DC | PRN
Start: 2022-02-28 — End: 2022-03-01

## 2022-02-28 MED ORDER — LACTATED RINGERS IV SOLN
500.0000 mL | Freq: Once | INTRAVENOUS | Status: AC
  Administered 2022-02-28: 500 mL via INTRAVENOUS

## 2022-02-28 MED ORDER — OXYTOCIN BOLUS FROM INFUSION: 333.0000 mL | Freq: Once | INTRAVENOUS | Status: DC

## 2022-02-28 MED ORDER — LIDOCAINE HCL (PF) 1 % IJ SOLN: 30.0000 mL | INTRAMUSCULAR | Status: DC | PRN

## 2022-02-28 MED ORDER — FENTANYL-BUPIVACAINE-NACL 0.5-0.125-0.9 MG/250ML-% EP SOLN
12.0000 mL/h | EPIDURAL | Status: DC | PRN
  Administered 2022-02-28 – 2022-03-01 (×2): 12 mL/h via EPIDURAL
  Filled 2022-02-28 (×2): qty 250

## 2022-02-28 MED ORDER — ONDANSETRON HCL 4 MG/2ML IJ SOLN
4.0000 mg | Freq: Four times a day (QID) | INTRAMUSCULAR | Status: DC | PRN
  Administered 2022-03-01: 4 mg via INTRAVENOUS
  Filled 2022-02-28: qty 2

## 2022-02-28 MED ORDER — PHENYLEPHRINE 80 MCG/ML (10ML) SYRINGE FOR IV PUSH (FOR BLOOD PRESSURE SUPPORT)
80.0000 ug | PREFILLED_SYRINGE | INTRAVENOUS | Status: DC | PRN
Start: 2022-02-28 — End: 2022-03-01
  Administered 2022-03-01 (×2): 80 ug via INTRAVENOUS
  Filled 2022-02-28: qty 10

## 2022-02-28 MED ORDER — ACETAMINOPHEN 325 MG PO TABS: 650.0000 mg | ORAL_TABLET | ORAL | Status: DC | PRN

## 2022-02-28 MED ORDER — OXYCODONE-ACETAMINOPHEN 5-325 MG PO TABS: 2.0000 | ORAL_TABLET | ORAL | Status: DC | PRN

## 2022-02-28 NOTE — H&P (Signed)
OBSTETRIC ADMISSION HISTORY AND PHYSICAL  Kelly Lopez is a 41 y.o. female [phone] with IUP at [redacted]w[redacted]d by LMP/early [place] presenting for termination of pregnancy in setting of lethal fetal anomaly (significant fetal ascites in setting of ruptured fetal bladder) . She reports no LOF, no VB.    Of note, patient was seen by [facility]) 3 weeks ago and found to have significant fetal ascites with concern for fetal bladder rupture. She had a previous US in first trimester at [facility] that showed enlarged fetal bladder.  After her MFM visit she was referred to [facility] and received a paracentesis (fetal) that was then thought to be urinary source. Despite removal of ascitic fluid through paracentesis the fluid reaccumulated within a few days on repeat imaging. She also went through genetic testing and counseling. Upon evaluation by multiple specialists including Dr. Parke Poisson (MFM) it was thought that given the above US findings that this pregnancy had poor prognosis given recurrent significant abdominal ascites and likely bladder rupture, with increased risk of IUFD. At that time options of pregnancy termination (given suspected lethal anomaly) and expectant management was discussed. And patient ultimately opted for pregnancy termination.  Of note, patient went through prior pregnancy termination (in 2020 for fetus with turner syndrome) at [facility] and opted to come here again for current treamtment.  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) - Anxiety (on Effexor) - GERD (takes Omeprazole daily)  Past Medical History: Past Medical History:  Diagnosis Date   Anxiety    Depression    Esophageal dysphagia    Psoriasis     Past Surgical History: Past Surgical History:  Procedure Laterality Date   DILATION AND EVACUATION N/A 08/31/2019   Procedure: DILATATION AND EVACUATION;  Surgeon: Tereso Newcomer, MD;  Location: [facility] LD ORS;  Service: Gynecology;  Laterality: N/A;   NO PAST SURGERIES      Obstetrical History: OB History     Gravida  4   Para  1   Term      Preterm  1   AB  2   Living  0      SAB  1   IAB  1   Ectopic      Multiple  0   Live Births              Social History Social History   Socioeconomic History   Marital status: Single    Spouse name: Not on file   Number of children: Not on file   Years of education: Not on file   Highest education level: Not on file  Occupational History   Not on file  Tobacco Use   Smoking status: Never   Smokeless tobacco: Never  Vaping Use   Vaping Use: Never used  Substance and Sexual Activity   Alcohol use: Not Currently   Drug use: Not Currently  Sexual activity: Yes    Birth control/protection: None  Other Topics Concern   Not on file  Social History Narrative   Not on file   Social Determinants of Health   Financial Resource Strain: Not on file  Food Insecurity: Not on file  Transportation Needs: Not on file  Physical Activity: Not on file  Stress: Not on file  Social Connections: Not on file    Family History: Family History  Problem Relation Age of Onset   Cancer Maternal Grandmother    Cancer Paternal Grandmother     Allergies: Allergies  Allergen Reactions   Sertraline Hcl Other (See Comments)   Ferrous Sulfate Rash     Medications Prior to Admission  Medication Sig Dispense Refill Last Dose   acetaminophen (TYLENOL) 500 MG tablet Take 500 mg by mouth every 6 (six) hours as needed.   02/28/2022   diphenhydrAMINE (BENADRYL) 25 mg capsule Take 25 mg by mouth at bedtime as needed for sleep.   Past Week   docusate sodium (COLACE) 100 MG capsule Take 100 mg by mouth 2 (two) times daily.   02/28/2022   loratadine (CLARITIN) 10 MG tablet Take 10 mg by mouth daily as needed for allergies.   02/27/2022   omeprazole (PRILOSEC) 20 MG capsule Take 20 mg by mouth daily.   02/28/2022   prenatal vitamin w/FE, FA (PRENATAL 1 + 1) 27-1 MG TABS tablet Take 1 tablet by mouth daily at 12 noon.   02/27/2022   valACYclovir (VALTREX) 500 MG tablet Take 500 mg by mouth daily.   Past Week   venlafaxine XR (EFFEXOR-XR) 150 MG 24 hr capsule Take 150 mg by mouth at bedtime.    02/28/2022   sulfamethoxazole-trimethoprim (BACTRIM DS) 800-160 MG tablet Take 1 tablet by mouth 2 (two) times daily. 14 tablet 0 More than a month     Review of Systems   All systems reviewed and negative except as stated in HPI  Blood pressure 117/71, pulse 80, temperature 98.4 F (36.9 C), temperature source Oral, resp. rate 18, height 5\' 3"  (1.6 m), weight 89.9 kg, last menstrual period 10/31/2021. General appearance: alert, no acute distress Lungs: clear to auscultation bilaterally Heart: regular rate and rhythm Abdomen: soft, non-tender; bowel sounds normal Extremities: Homans sign is negative, no sign of DVT Fetal monitoring : not being monitored given indication for induction Uterine activityNone Dilation: Closed Effacement (%): Thick Station: -3 Exam by:: Dr. Ephriam Jenkinsas   Prenatal labs: ABO, Rh: --/--/A POS (06/11 2216) Antibody: NEG (06/11 2216) RPR:   pending GBS:   unknown 1 hr Glucola N/A Genetic screening  microarray with gene deletion of unknown significance Anatomy US significant fetal abdominal ascites with concern for bladder rupture,  clubbed feet  Prenatal Transfer Tool  Maternal Diabetes: No Genetic Screening: microarray with gene deletion of unknown significance Maternal Ultrasounds/Referrals: Other:significant fetal abdominal ascites with concern for bladder rupture, clubbed feet Fetal Ultrasounds or other Referrals:  Other: None Maternal Substance Abuse:  Yes:  Type: Marijuana Significant Maternal Medications:  Meds include: Other: Effexor Significant Maternal Lab Results: Other: GBS unknown  Results for orders placed or performed during the hospital encounter of 02/28/22 (from the past 24 hour(s))  CBC   Collection Time: 02/28/22  9:54 PM  Result Value Ref Range   WBC 8.7 4.0 - 10.5 K/uL   RBC 4.00 3.87 - 5.11 MIL/uL   Hemoglobin 12.2 12.0 - 15.0 g/dL   HCT 16.135.2 (L) 09.636.0 - 04.546.0 %   MCV 88.0  80.0 - 100.0 fL   MCH 30.5 26.0 - 34.0 pg   MCHC 34.7 30.0 - 36.0 g/dL   RDW 35.4 56.2 - 56.3 %   Platelets 295 150 - 400 K/uL   nRBC 0.0 0.0 - 0.2 %  Type and screen [facility]   Collection Time: 02/28/22 10:16 PM  Result Value Ref Range   ABO/RH(D) A POS    Antibody Screen NEG    Sample Expiration      03/03/2022,2359 Performed at [facility], 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 G4P0120 with IUP at [redacted]w[redacted]d by LMP/early [place] presenting for induction/termination of pregnancy in setting of lethal fetal anomaly (significant fetal ascites in setting of ruptured fetal bladder) .  #Labor/induction: cervix closed, thick, and high. Will start with  buccal and vaginal cytotec. Recheck in 4 hours. #Pain: epidural. Discussed with patient she can get epidural as soon as she feels ready. She would like to get it now.   #Lethal fetal anomaly Discussed with patient regarding wishes for after delivery. She would like to see the infant but unsure if she would like to hold infant. Her partner who is at bedside would not like to see infant. We discussed genetic testing and she declines given there was already genetic testing done. She also declines an autopsy.  #Depression Continue Effexor (Venlafaxine)  #GERD Takes protonix daily. Will continue here.  Warner Mccreedy, MD, MPH OB Fellow, Faculty Practice

## 2022-02-28 NOTE — Anesthesia Preprocedure Evaluation (Signed)
Anesthesia Evaluation  Patient identified by MRN, date of birth, ID band Patient awake    Reviewed: Allergy & Precautions, NPO status , Patient's Chart, lab work & pertinent test results  History of Anesthesia Complications Negative for: history of anesthetic complications  Airway Mallampati: II   Neck ROM: Full    Dental   Pulmonary neg pulmonary ROS,    Pulmonary exam normal        Cardiovascular negative cardio ROS Normal cardiovascular exam     Neuro/Psych PSYCHIATRIC DISORDERS Anxiety Depression negative neurological ROS     GI/Hepatic negative GI ROS, (+)     substance abuse  marijuana use,   Endo/Other  negative endocrine ROS  Renal/GU negative Renal ROS     Musculoskeletal negative musculoskeletal ROS (+)   Abdominal   Peds  Hematology negative hematology ROS (+)   Anesthesia Other Findings   Reproductive/Obstetrics (+) Pregnancy  Recurrent miscarriage Fetal anomaly                              Anesthesia Physical Anesthesia Plan  ASA: 2  Anesthesia Plan: Epidural   Post-op Pain Management:    Induction:   PONV Risk Score and Plan: 2 and Treatment may vary due to age or medical condition  Airway Management Planned: Natural Airway  Additional Equipment: None  Intra-op Plan:   Post-operative Plan:   Informed Consent: I have reviewed the patients History and Physical, chart, labs and discussed the procedure including the risks, benefits and alternatives for the proposed anesthesia with the patient or authorized representative who has indicated his/her understanding and acceptance.       Plan Discussed with: Anesthesiologist  Anesthesia Plan Comments: (Labs reviewed. Platelets acceptable, patient not taking any blood thinning medications. Per RN, FHR tracing reported to be stable enough for sitting procedure. Risks and benefits discussed with patient,  including PDPH, backache, epidural hematoma, failed epidural, blood pressure changes, allergic reaction, and nerve injury. Patient expressed understanding and wished to proceed.)        Anesthesia Quick Evaluation

## 2022-02-28 NOTE — Anesthesia Procedure Notes (Signed)
Epidural Patient location during procedure: OB Start time: 02/28/2022 11:47 PM End time: 02/28/2022 11:50 PM  Staffing Anesthesiologist: Audry Pili, MD Performed: anesthesiologist   Preanesthetic Checklist Completed: patient identified, IV checked, risks and benefits discussed, monitors and equipment checked, pre-op evaluation and timeout performed  Epidural Patient position: sitting Prep: DuraPrep Patient monitoring: continuous pulse ox and blood pressure Approach: midline Location: L2-L3 Injection technique: LOR saline  Needle:  Needle type: Tuohy  Needle gauge: 17 G Needle length: 9 cm Needle insertion depth: 5 cm Catheter size: 19 Gauge Catheter at skin depth: 10 cm Test dose: negative and Other (1% lidocaine)  Assessment Events: blood not aspirated  Additional Notes Patient identified. Risks including, but not limited to, bleeding, infection, nerve damage, paralysis, inadequate analgesia, blood pressure changes, nausea, vomiting, allergic reaction, postpartum back pain, itching, and headache were discussed. Patient expressed understanding and wished to proceed. Sterile prep and drape, including hand hygiene, mask, and sterile gloves were used. The patient was positioned and the spine was prepped. The skin was anesthetized with lidocaine. No paraesthesia or other complication noted. The patient did not experience any signs of intravascular injection such as tinnitus or metallic taste in mouth, nor signs of intrathecal spread such as rapid motor block. Please see nursing notes for vital signs. The patient tolerated the procedure well.   Renold Don, MDReason for block:procedure for pain

## 2022-03-01 ENCOUNTER — Encounter (HOSPITAL_COMMUNITY): Payer: Self-pay | Admitting: Obstetrics & Gynecology

## 2022-03-01 ENCOUNTER — Other Ambulatory Visit: Payer: Self-pay

## 2022-03-01 DIAGNOSIS — O99324 Drug use complicating childbirth: Secondary | ICD-10-CM

## 2022-03-01 DIAGNOSIS — Z3A17 17 weeks gestation of pregnancy: Secondary | ICD-10-CM

## 2022-03-01 DIAGNOSIS — O359XX0 Maternal care for (suspected) fetal abnormality and damage, unspecified, not applicable or unspecified: Secondary | ICD-10-CM

## 2022-03-01 LAB — RPR: RPR Ser Ql: NONREACTIVE

## 2022-03-01 MED ORDER — ONDANSETRON HCL 4 MG/2ML IJ SOLN: 4.0000 mg | INTRAMUSCULAR | Status: DC | PRN

## 2022-03-01 MED ORDER — MISOPROSTOL 200 MCG PO TABS
800.0000 ug | ORAL_TABLET | Freq: Once | ORAL | Status: AC
  Administered 2022-03-01: 800 ug via ORAL
  Filled 2022-03-01: qty 4

## 2022-03-01 MED ORDER — PANTOPRAZOLE SODIUM 40 MG PO TBEC
40.0000 mg | DELAYED_RELEASE_TABLET | Freq: Every day | ORAL | Status: DC
  Administered 2022-03-01 – 2022-03-02 (×2): 40 mg via ORAL
  Filled 2022-03-01 (×2): qty 1

## 2022-03-01 MED ORDER — ONDANSETRON HCL 4 MG PO TABS: 4.0000 mg | ORAL_TABLET | ORAL | Status: DC | PRN

## 2022-03-01 MED ORDER — VENLAFAXINE HCL ER 150 MG PO CP24
150.0000 mg | ORAL_CAPSULE | Freq: Every day | ORAL | Status: DC
  Administered 2022-03-02: 150 mg via ORAL
  Filled 2022-03-01: qty 1

## 2022-03-01 MED ORDER — WITCH HAZEL-GLYCERIN EX PADS: 1.0000 "application " | MEDICATED_PAD | CUTANEOUS | Status: DC | PRN

## 2022-03-01 MED ORDER — DIBUCAINE (PERIANAL) 1 % EX OINT: 1.0000 "application " | TOPICAL_OINTMENT | CUTANEOUS | Status: DC | PRN

## 2022-03-01 MED ORDER — MISOPROSTOL 200 MCG PO TABS
800.0000 ug | ORAL_TABLET | Freq: Once | ORAL | Status: AC
Start: 2022-03-01 — End: 2022-03-01
  Administered 2022-03-01: 800 ug via ORAL
  Filled 2022-03-01: qty 4

## 2022-03-01 MED ORDER — DIPHENHYDRAMINE HCL 25 MG PO CAPS
25.0000 mg | ORAL_CAPSULE | Freq: Four times a day (QID) | ORAL | Status: DC | PRN
  Administered 2022-03-01: 25 mg via ORAL
  Filled 2022-03-01: qty 1

## 2022-03-01 MED ORDER — ZOLPIDEM TARTRATE 5 MG PO TABS
5.0000 mg | ORAL_TABLET | Freq: Every evening | ORAL | Status: DC | PRN
  Administered 2022-03-01: 5 mg via ORAL
  Filled 2022-03-01: qty 1

## 2022-03-01 MED ORDER — OXYTOCIN 10 UNIT/ML IJ SOLN: 20.0000 [IU] | Freq: Once | INTRAMUSCULAR | Status: AC

## 2022-03-01 MED ORDER — MEASLES, MUMPS & RUBELLA VAC IJ SOLR: 0.5000 mL | Freq: Once | INTRAMUSCULAR | Status: DC

## 2022-03-01 MED ORDER — SENNOSIDES-DOCUSATE SODIUM 8.6-50 MG PO TABS
2.0000 | ORAL_TABLET | ORAL | Status: DC
  Administered 2022-03-01: 2 via ORAL
  Filled 2022-03-01 (×2): qty 2

## 2022-03-01 MED ORDER — SIMETHICONE 80 MG PO CHEW: 80.0000 mg | CHEWABLE_TABLET | ORAL | Status: DC | PRN

## 2022-03-01 MED ORDER — TETANUS-DIPHTH-ACELL PERTUSSIS 5-2.5-18.5 LF-MCG/0.5 IM SUSY: 0.5000 mL | PREFILLED_SYRINGE | Freq: Once | INTRAMUSCULAR | Status: DC

## 2022-03-01 MED ORDER — VENLAFAXINE HCL ER 150 MG PO CP24
150.0000 mg | ORAL_CAPSULE | Freq: Every day | ORAL | Status: DC
  Administered 2022-03-01: 150 mg via ORAL
  Filled 2022-03-01: qty 1

## 2022-03-01 MED ORDER — IBUPROFEN 600 MG PO TABS
600.0000 mg | ORAL_TABLET | Freq: Four times a day (QID) | ORAL | Status: DC
  Administered 2022-03-01 – 2022-03-02 (×3): 600 mg via ORAL
  Filled 2022-03-01 (×3): qty 1

## 2022-03-01 MED ORDER — OXYTOCIN 10 UNIT/ML IJ SOLN
INTRAMUSCULAR | Status: AC
  Administered 2022-03-01: 20 [IU]
  Filled 2022-03-01: qty 2

## 2022-03-01 MED ORDER — ACETAMINOPHEN 325 MG PO TABS: 650.0000 mg | ORAL_TABLET | ORAL | Status: DC | PRN

## 2022-03-01 MED ORDER — BENZOCAINE-MENTHOL 20-0.5 % EX AERO: 1.0000 "application " | INHALATION_SPRAY | CUTANEOUS | Status: DC | PRN

## 2022-03-01 NOTE — Discharge Summary (Signed)
Postpartum Discharge Summary    Patient Name: Kelly Lopez DOB: Jun 11, 1981 MRN: [id number]  Date of admission: 02/28/2022 Delivery date:03/01/2022  Delivering provider: Julianne Handler  Date of discharge: 03/02/2022  Admitting diagnosis: Fetal anomaly necessitating delivery [O35.9XX0] Intrauterine pregnancy: [redacted]w[redacted]d    Secondary diagnosis:  Principal Problem:   Fetal anomaly necessitating delivery Active Problems:   SVD (spontaneous vaginal delivery)  Additional problems: hx of prior pregnancy with fetal Turners Syndrome, hx retained placenta, anxiety, GERD    Discharge diagnosis:  TAB                                               Post partum procedures: None Augmentation: Cytotec Complications: None  Hospital course: Induction of Labor With Vaginal Delivery   41y.o. yo [phone] admitted to the hospital 02/28/2022 for induction of labor.  Indication for induction:  lethal fetal anomalies .  Patient had an uncomplicated labor course as follows after induction with high dose misoprostol: Membrane Rupture Time/Date: 4:42 PM ,03/01/2022   Delivery Method:Vaginal, Spontaneous  Episiotomy: None  Lacerations:  None  Details of delivery can be found in separate delivery note.  Patient had a routine postpartum course.Appropriate support given to her.  Patient is discharged home 03/02/22. [facility] Data: Birth date:03/01/2022  Birth time:4:42 PM  Gender:Female  Living status:Fetal Demise   Magnesium Sulfate received: No BMZ received: No Rhophylac:N/A MMR:N/A T-DaP: no Flu: No Transfusion:No  Physical exam  Vitals:   03/01/22 1815 03/01/22 1845 03/01/22 1946 03/02/22 0838  BP: (!) 96/52 110/67 119/76 114/65  Pulse: 77 77 86 78  Resp:  _0 Temp:  97.8 F (36.6 C) 97.7 F (36.5 C) 98.2 F (36.8 C)  TempSrc:  Oral Oral Oral  SpO2:  96% 100% 99%  Weight:      Height:       General:  alert, cooperative, and no distress Lochia: appropriate Uterine Fundus: firm Incision: N/A DVT Evaluation: No evidence of DVT seen on physical exam. Negative Homan's sign. No cords or calf tenderness. Labs: Lab Results  Component Value Date   WBC 8.7 02/28/2022   HGB 12.2 02/28/2022   HCT 35.2 (L) 02/28/2022   MCV 88.0 02/28/2022   PLT 295 02/28/2022      Latest Ref Rng & Units 09/16/2020    1:36 PM  CMP  Glucose 70 - 99 mg/dL 101   BUN 6 - 20 mg/dL 11   Creatinine 0.44 - 1.00 mg/dL 0.79   Sodium 135 - 145 mmol/L 140   Potassium 3.5 - 5.1 mmol/L 4.4   Chloride 98 - 111 mmol/L 104   CO2 22 - 32 mmol/L 27   Calcium 8.9 - 10.3 mg/dL 9.4    Edinburgh Score:     No data to display           After visit meds:  Allergies as of 03/02/2022       Reactions   Sertraline Hcl Other (See Comments)   Ferrous Sulfate Rash        Medication List     STOP taking these medications    acetaminophen 500 MG tablet Commonly known as: TYLENOL   diphenhydrAMINE 25 mg capsule Commonly known as: BENADRYL   docusate sodium 100  MG capsule Commonly known as: COLACE   loratadine 10 MG tablet Commonly known as: CLARITIN   omeprazole 20 MG capsule Commonly known as: PRILOSEC   prenatal vitamin w/FE, FA 27-1 MG Tabs tablet   sulfamethoxazole-trimethoprim 800-160 MG tablet Commonly known as: BACTRIM DS   valACYclovir 500 MG tablet Commonly known as: VALTREX       TAKE these medications    ibuprofen 600 MG tablet Commonly known as: ADVIL Take 1 tablet (600 mg total) by mouth every 6 (six) hours.   venlafaxine XR 150 MG 24 hr capsule Commonly known as: EFFEXOR-XR Take 150 mg by mouth at bedtime.         Discharge home in stable condition Discharge instruction: per After Visit Summary Activity: Advance as tolerated. Pelvic rest for 3 weeks.  Diet: routine diet Future Appointments:No future appointments. Follow up Visit: [facility]  [place]   03/02/2022 Ugonna Anyanwu, MD    

## 2022-03-01 NOTE — Progress Notes (Signed)
Labor Progress Note Kelly Lopez is a 41 y.o. G4P0120 at [redacted]w[redacted]d presented for termination d/t lethal fetal anomalies  S:  Comfortable with epidural, no c/o.   O:  BP (!) 101/54   Pulse 88   Temp (!) 97.5 F (36.4 C) (Axillary)   Resp 17   Ht 5\' 3"  (1.6 m)   Wt 89.9 kg   LMP 10/31/2021   BMI 35.13 kg/m  SVE: Dilation: Closed Effacement (%): 70 Station: -3 Exam by:: Melani Hayde Kilgour, CNM   A/P: 41 y.o. G4P0120 [redacted]w[redacted]d  1. Labor: latent 2. Pain: epidural    Will increase Cytotec dose. Anticipate labor progress and delivery. Confirmed with pt her wishes to see but not hold baby.  [redacted]w[redacted]d, CNM 9:13 AM

## 2022-03-01 NOTE — Progress Notes (Signed)
Labor Progress Note Kelly Lopez is a 41 y.o. G4P0120 at [redacted]w[redacted]d presented for termination d/t lethal fetal anomalies  S:  Comfortable with epidural, felt some cramping.   O:  BP 111/60   Pulse 73   Temp 99.7 F (37.6 C) (Oral)   Resp 17   Ht 5\' 3"  (1.6 m)   Wt 89.9 kg   LMP 10/31/2021   BMI 35.13 kg/m  SVE: Dilation: Fingertip Effacement (%): 70 Station: -3 Exam by:: Melani Emeree Mahler, CNM   A/P: 41 y.o. G4P0120 [redacted]w[redacted]d  1. Labor: latent 2. Pain: epidural  Some cervical change on this exam. Will give another dose of Cytotec. Anticipate labor progress and delivery.  [redacted]w[redacted]d, CNM 1:55 PM

## 2022-03-01 NOTE — Progress Notes (Signed)
Delayed documentation  Cervix continues to be closed. Patient not feeling much in terms of contractions - has epidural in place. On monitor not picking up any contractions.  Will do another dose of Cytotec (232mcg buccal and 228mcg Vaginal)  At next check if still closed and not having contraction on monitor will plan to increase cytotec dose.  Renard Matter, MD, MPH OB Fellow, Faculty Practice

## 2022-03-02 MED ORDER — IBUPROFEN 600 MG PO TABS: 600.0000 mg | ORAL_TABLET | Freq: Four times a day (QID) | ORAL | 0 refills | Status: DC

## 2022-03-02 NOTE — Plan of Care (Signed)
  Problem: Education: Goal: Knowledge of General Education information will improve Description: Including pain rating scale, medication(s)/side effects and non-pharmacologic comfort measures Outcome: Adequate for Discharge   Problem: Health Behavior/Discharge Planning: Goal: Ability to manage health-related needs will improve Outcome: Adequate for Discharge   Problem: Clinical Measurements: Goal: Ability to maintain clinical measurements within normal limits will improve Outcome: Adequate for Discharge Goal: Will remain free from infection Outcome: Adequate for Discharge Goal: Diagnostic test results will improve Outcome: Adequate for Discharge Goal: Respiratory complications will improve Outcome: Adequate for Discharge Goal: Cardiovascular complication will be avoided Outcome: Adequate for Discharge   Problem: Activity: Goal: Risk for activity intolerance will decrease Outcome: Adequate for Discharge   Problem: Nutrition: Goal: Adequate nutrition will be maintained Outcome: Adequate for Discharge   Problem: Coping: Goal: Level of anxiety will decrease Outcome: Adequate for Discharge   Problem: Elimination: Goal: Will not experience complications related to bowel motility Outcome: Adequate for Discharge Goal: Will not experience complications related to urinary retention Outcome: Adequate for Discharge   Problem: Pain Managment: Goal: General experience of comfort will improve Outcome: Adequate for Discharge   Problem: Safety: Goal: Ability to remain free from injury will improve Outcome: Adequate for Discharge   Problem: Skin Integrity: Goal: Risk for impaired skin integrity will decrease Outcome: Adequate for Discharge   Problem: Education: Goal: Knowledge of condition will improve Outcome: Adequate for Discharge Goal: Individualized Educational Video(s) Outcome: Adequate for Discharge Goal: Individualized Newborn Educational Video(s) Outcome: Adequate for  Discharge   Problem: Activity: Goal: Will verbalize the importance of balancing activity with adequate rest periods Outcome: Adequate for Discharge Goal: Ability to tolerate increased activity will improve Outcome: Adequate for Discharge   Problem: Coping: Goal: Ability to identify and utilize available resources and services will improve Outcome: Adequate for Discharge   Problem: Life Cycle: Goal: Chance of risk for complications during the postpartum period will decrease Outcome: Adequate for Discharge   Problem: Role Relationship: Goal: Ability to demonstrate positive interaction with newborn will improve Outcome: Adequate for Discharge   Problem: Skin Integrity: Goal: Demonstration of wound healing without infection will improve Outcome: Adequate for Discharge   Problem: Education: Goal: Knowledge of disease or condition will improve Outcome: Adequate for Discharge Goal: Knowledge of the prescribed therapeutic regimen will improve Outcome: Adequate for Discharge   Problem: Coping: Goal: Ability to identify and utilize appropriate coping strategies will improve Outcome: Adequate for Discharge Goal: Ability to identify and utilize available support systems will improve Outcome: Adequate for Discharge Goal: Will verbalize feelings Outcome: Adequate for Discharge Goal: Decrease level of anxiety will Outcome: Adequate for Discharge   Problem: Clinical Measurements: Goal: Will show no signs and symptoms of excessive bleeding Outcome: Adequate for Discharge

## 2022-03-02 NOTE — Progress Notes (Signed)
   03/02/22 1114  Departure Condition  Departure Condition Good  Mobility at [facility]  Patient/Caregiver Teaching Teach Back Method Used;Discharge instructions reviewed;Prescriptions reviewed;Follow-up care reviewed;Pain management discussed;Medications discussed;Patient/caregiver verbalized understanding  Departure Mode With significant other  Was procedural sedation performed on this patient during this visit? No   Patient alert and oriented x4, and VS and pain stable.

## 2022-03-02 NOTE — Anesthesia Postprocedure Evaluation (Signed)
Anesthesia Post Note  Patient: Kelly Lopez  Procedure(s) Performed: AN AD HOC LABOR EPIDURAL     Patient location during evaluation: Specials Recovery Anesthesia Type: Epidural Level of consciousness: awake and alert Pain management: pain level controlled Vital Signs Assessment: post-procedure vital signs reviewed and stable Respiratory status: spontaneous breathing, nonlabored ventilation and respiratory function stable Cardiovascular status: stable Postop Assessment: no headache, no backache and epidural receding Anesthetic complications: no   No notable events documented.  Last Vitals:  Vitals:   03/01/22 1845 03/01/22 1946  BP: 110/67 119/76  Pulse: 77 86  Resp: 18 18  Temp: 36.6 C 36.5 C  SpO2: 96% 100%    Last Pain:  Vitals:   03/02/22 0810  TempSrc:   PainSc: 0-No pain   Pain Goal: Patients Stated Pain Goal: 0 (03/01/22 2200)                 Sim Choquette

## 2022-03-05 LAB — SURGICAL PATHOLOGY

## 2022-03-10 ENCOUNTER — Telehealth (HOSPITAL_COMMUNITY): Payer: Self-pay | Admitting: *Deleted

## 2022-03-10 NOTE — Telephone Encounter (Signed)
Left phone voicemail message.  Duffy Rhody, RN 03-10-2022 at 9:34am

## 2022-12-07 LAB — OB RESULTS CONSOLE ABO/RH: RH Type: POSITIVE

## 2022-12-07 LAB — OB RESULTS CONSOLE HIV ANTIBODY (ROUTINE TESTING): HIV: NONREACTIVE

## 2022-12-07 LAB — OB RESULTS CONSOLE GC/CHLAMYDIA
Chlamydia: NEGATIVE
Neisseria Gonorrhea: NEGATIVE

## 2022-12-07 LAB — OB RESULTS CONSOLE RPR: RPR: NONREACTIVE

## 2022-12-07 LAB — OB RESULTS CONSOLE RUBELLA ANTIBODY, IGM: Rubella: IMMUNE

## 2022-12-07 LAB — HEPATITIS C ANTIBODY: HCV Ab: NEGATIVE

## 2022-12-07 LAB — OB RESULTS CONSOLE VARICELLA ZOSTER ANTIBODY, IGG: Varicella: NON-IMMUNE/NOT IMMUNE

## 2022-12-07 LAB — OB RESULTS CONSOLE ANTIBODY SCREEN: Antibody Screen: NEGATIVE

## 2022-12-07 LAB — OB RESULTS CONSOLE HEPATITIS B SURFACE ANTIGEN: Hepatitis B Surface Ag: NEGATIVE

## 2023-01-04 DIAGNOSIS — O09899 Supervision of other high risk pregnancies, unspecified trimester: Secondary | ICD-10-CM | POA: Insufficient documentation

## 2023-01-04 DIAGNOSIS — Z2839 Other underimmunization status: Secondary | ICD-10-CM | POA: Insufficient documentation

## 2023-02-03 ENCOUNTER — Telehealth (INDEPENDENT_AMBULATORY_CARE_PROVIDER_SITE_OTHER): Payer: No Typology Code available for payment source

## 2023-02-03 DIAGNOSIS — O0990 Supervision of high risk pregnancy, unspecified, unspecified trimester: Secondary | ICD-10-CM | POA: Insufficient documentation

## 2023-02-03 DIAGNOSIS — Z3689 Encounter for other specified antenatal screening: Secondary | ICD-10-CM

## 2023-02-03 DIAGNOSIS — B009 Herpesviral infection, unspecified: Secondary | ICD-10-CM | POA: Insufficient documentation

## 2023-02-03 DIAGNOSIS — O09529 Supervision of elderly multigravida, unspecified trimester: Secondary | ICD-10-CM | POA: Insufficient documentation

## 2023-02-03 DIAGNOSIS — O09522 Supervision of elderly multigravida, second trimester: Secondary | ICD-10-CM

## 2023-02-03 NOTE — Patient Instructions (Signed)
[place] [facility] Providers  [place] ([address]) [facility] Brown, MD; Chambliss, MD; Eniola, MD; Hensel, MD; McDiarmid, MD; McIntyer, MD [address] [place], [place] [address] [phone] Mon-Fri 8:30-12:30, 1:30-5:00  Providers come to see babies during newborn hospitalization Only accepting infants of Mother's who are seen at Family Medicine Center or have siblings seen at   [facility] Center Medicaid - Yes; Tricare - Yes   [facility] Mulberry, MD [address] [place], [place] [address] [phone] Mon, Tue, Thur, Fri 8:30-5:00, Wed 10:00-7:00 (closed 1-2pm daily for lunch) Takes [place] residents with no insurance.  [place] [facility] only with Medicaid/insurance; Tricare - no  [facility] Ben-Davies, MD; Brown, MD; Chandler, MD; Ettefagh, MD; Grant, MD; Hanvey, MD; Herrin, MD; Jones,  MD; Lester, MD; McCormick, MD; McQueen, MD; Simha, MD; Stanley, MD; Stryffeler, NP [address], [place], [place] [address] [phone] Mon, Tue, Thur, Fri 8:30-5:30, Wed 9:30-5:30, Sat 8:30-12:30 Only accepting infants of first-time parents or siblings of current patients Hospital discharge coordinator will make follow-up appointment Medicaid - yes; Tricare - yes  [place] ([address]) [facility] Cox, MD; Davis, MD; Dovico, MD; Ettefaugh, MD; Lowe, MD; Nation, MD; Slimp, MD; Sumner, MD; Williams, MD [address], [place], [place] [address] [phone] Mon-Fri 8:30-5:00, closed for lunch 12:30-1:30; Sat-Sun 10:00-1:00 Accepting Newborns with commercial insurance only, must call prior to delivery to be accepted into  practice.  Medicaid - no, Tricare - yes   [facility] [address] [place], [place] [address] [phone] or [phone] Mon to Fri 8am to 10pm, Sat 8am to 1pm  (virtual only on weekends) Only accepts Medicaid Healthy Blue pts  [facility]  Artis, MD; Coccaro, MD; [place] Gardner, MD; Netherton, NP; Roper, MD; Wilmot, PA-C; Skinner, MD [address] [place], [place] [address] [phone] Mon-Fri 8:30-5:30 Medicaid - yes, Tricare - yes  [place] ([address]) [facility] Warner, MD [address], [place], [place] [address] [phone] Mon, Tues, Wed Fri 8:30-5:00, Sat 8:30-12:00, Closed Thursdays Accepting siblings of established patients and first time mom's if you call prenatally Medicaid- yes; Tricare - yes  [facility] Becker, PA; Hagler, MD; Quinn, PA-C; Scifres, PA; Sun, MD; Swayne, MD;  [address], [place], [place] [address] [phone] Mon-Fri 8:30-5:00, closed for lunch 1-2 Only accepting newborns of established patients Medicaid- no; Tricare - yes  [place] ([address]) [facility] Timberlake, MD; [address], [place], [place] [address] [phone] Mon-Fri 8:00-5:00 Medicaid - No; Tricare - Yes  [facility] [employer]  Brake, NP; Wharton, PA [address], [place], [place] [address] [phone] Mon-Fri 8:00-5:00 Medicaid - No, Tricare - Yes  [facility] Gay, MD; Quinlan, MD; Blatt, DNP [address] [place], [place] [address] [phone]  Mon-Fri 8:00-5:00 Medicaid - No; Tricare - Yes  [facility] [address] [place], [place] [address] [phone] Mon-Fri 8:30-5:00 (lunch 12:00-1:00) Medicaid -Yes; Tricare - Yes  [facility] Jordan, MD [address], [place],  [address] [phone] Mon-Fri 8:00-5:00 Seeing newborns of current patients only. No new patients Medicaid - No, Tricare - yes  [facility] Parker, MD [address] [place],  [address] [phone] Mon-Fri 8:00-5:00 Medicaid -yes as secondary coverage only;   Tricare - yes  [facility] Brecken, PA; Christy, NP; Dees, MD; DeClaire, MD; DeWeese, MD; Hodge, PA; Smoot, NP; Summer, MD; Vapne, MD [address] [place], [place] [address] [phone] Mon-Fri 8:30-5:00, Sat 9:00-11:00 Accepts commercial insurance ONLY. Offers free prenatal information sessions for families. Medicaid - No, Tricare - Call first  [facility] Bouska, MD; Gordon, PA; Jeffery, PA; Weber, PA [address] [place] [address] [phone] Mon-Fri 7:30-5:30 Medicaid - Yes; Tricare - yes  [place] ([address] & [address])  [facility] Reese, MD [address] [place], [place] [address] [phone] Mon-Thur 8:00-6:00, closed for lunch 12-2, closed Fridays Medicaid - yes; Tricare - no  [facility] Anderson, NP; Badger, MD; Beal, PA; Spencer, PA [address], [place], [place] [address] [phone] Mon-Fri 7:30-4:30 Medicaid - yes, Tricare - yes  [facility]  Agbuya, MD; Klett, NP; Romgoolam, MD; Rothstein, NP [address], [place], [place] [address] [phone] Mon-Fri 8:30-5:00, closed for lunch 1-2, Sat 8:30-12:00 - sick visits only Providers come to see babies at [facility] Only accepting newborns of siblings and first time parents ONLY if who have met with office prior to delivery Medicaid -Yes; Tricare - yes  [facility] [place]  Golden, DO; Friddle, NP; Wallace, MD; Wood, MD:  [address] [place] [address], [place], [place] [address] [phone] Mon- Fri 8:00-5:00, Sat 9:00-12:00 - sick visits only Accepting siblings of established patients and first time mom/baby Medicaid - Yes; Tricare - yes Patients must have vaccinations (baby vaccines)  [place] ([address] &  [address])  [facility] [address] [place], [place] [address] [phone] Mon-Fri 8:00-5:00 Medicaid - no; Tricare - yes  [facility] Briscoe, MD; Schmidt, PA; Moreira, PA [address], [place], [place] [address] [phone] Mon-Fri 8:00-5:00 Medicaid- yes; Tricare - yes  [facility] Boyd, MD; Jones, NP; Osborn, PA [address], [place], [place] [address] [phone] Mon-Fri 8:00-5:00 Medicaid - Yes; Tricare - yes  [place] ([address])  [facility] Atkinson, PA; Calderon, PA; Cummings, MD; Dillard, MD; Henrish, NP; Isenhour, DO; Martin, PA; Olson, MD; Ott, MD; Phillips, MD; Valente, PA; VanDeven, PA; Yonjof, NP [address], [place], [place] [address] [phone] Mon-Fri 8:30-5:00, Sat 9:00-12:00 - sick only Please register online [email or web address] then schedule online or call office Medicaid-Yes; Tricare -yes  [facility]  Dabrusco, MD; Dial, MD; La Dolores, MD; Fleenor, NP; Goolsby, PA; Tonuzi, MD; Turner, NP; West, MD [address], [place], [place] [address] [phone] Mon-Fri 8:00-5:30, Sat&Sun by appointment (phones open at 8:30) Medicaid - Yes; Tricare - yes  [place] ([address]) [facility] Allen, CPNP; Bates, MD; Gordon, MD; Mills, NP; Weinshilboum, DO [address], [place],  [address] [phone] M-F 8:00 - 5:15, Sat/Sun 9-12 sick visits only Medicaid - No; Tricare - yes  [facility] [place] [facility]  Brown, PA-C; Cowen, PA-C; Dennis, DO; Fuster, PA-C; Martin, PA-C; Shelton, PA-C; Spry, MD [address] [place],  [address] [phone] Mon-Thur 8:00-7:00, Fri 8:00-5:00 Accepting Medicaid for 13 and under only   [facility] [place] [facility] [place] Hayes, FNP; List, FNP; Moran, MD; Pitonzo, PA-C; Scholer,   MD; Spangle, FNP; Nzenwa, FNP; Jasper, MD; Moran, MD [address] [place] [address] [place], [place] [address] [phone] Mon-Fri 8:30-5:30 Medicaid - Yes; Tricare - yes  [facility] [address] [facility]  Kelly, CPNP; Logan, MD; Poth, MD; Ramadoss, MD; Staton, NP [address], [place], [place] [address] [phone] Mon-Thur 8:00-5:30, Fri 8:00-5:00, Sat 9:00-12:00 Medicaid - yes, Tricare - yes  [place] ([address])  [facility] [place] Masneri, DO; Meyers, MD; Nelson, PA [address], [place], [place] [address] [phone] Mon-Fri 8:00-5:00, closed for lunch 12-1 Medicaid - No; Tricare - yes  [facility] [place] McGowen, MD [address], [place], [place] [address] [phone] Mon-Fri 8:00-5:00 Medicaid - No; Tricare - yes  [facility] [place] MacDonald, MD; Nayak, MD; Kearns, MD; Jones, MD [address] [place] [address], [place], [place] [address] [phone] Mon-Fri 8:00-5:00 Medicaid- Yes; Tricare - yes  [place] ([address])  [facility] [place] Martin, PA-C; Tabori, MD [address], [place], [place] [address] [phone] Mon-Fri 8:00-5:00 Medicaid - No; Tricare - yes  [facility] [place]  Margin - CPNP [address], [place], [place] [address] [phone] Mon-Weds 8:00-6:00, Thurs-Fri 8:00-5:00, Sat 9:00-12:00 Medicaid - yes; Tricare - yes   [facility] [place] Aubuchon, MD; Brandon, PA [address] [place], [place] [address] [phone] Mon-Fri 8:00-5:00 Medicaid - yes; Tricare - yes  [place] [facility] Providers  [facility] [address], [place], [place] [address] [phone] M, Thur: 8am -8pm, Tues, Weds: 8am - 5pm; Fri: 8-1 Medicaid - Yes; Tricare -  yes  [facility] Mertz, MD; Johnson, MD; Wells, MD; Downs, PA; Hockenberger, PA [address], [place], [place] [address] [phone] M-F 8:30 - 5:00 Medicaid - Call office; Tricare -yes  [facility] Bonney, MD; Page, MD, Minter, MD; Mueller, PNP; Thomason, NP [address], [place], [place] [address] [phone] M-F 8:30 - 5:00, Sat/Sun 8:30 - 12:30 (sick visits) Medicaid - Call office; Tricare -yes  [facility] Lewis, MD; Shaub, PNP; Boylston, MD; Quaile, PA; Nonato, NP; Landon, CPNP [address], [place], [place] [address] [phone] M-F 8:30 - 5:00 Medicaid - Call office; Tricare - yes  [facility] [place] Cline, MD; Dvergsten, MD; Flores, MD; Kawatu, MD; Nogo, MD [address], [place], [place] [address] [phone] M-Thur: 8:00 - 5:00; Fri: 8:00 - 4:00 Medicaid - yes; Tricare - yes  [facility] [address] [place], [place], [place] [address] [phone] M-F: 8:30- 5:00, closed for lunch 12:30 - 1:00 Medicaid - yes; Tricare -yes  [facility] [place] [address], [place], [place] [address] [phone] M-F 8:00 - 5:00 Medicaid - yes; Tricare - yes  [facility] Johnson, DO; Rumball, DO; Wicker, NP [address] [place] [address], [place], [place] [address] [phone] M-F 8:00 - 5:00, Closed 12-1 for lunch Medicaid - Call; Tricare - yes  [facility] Stein, MD [address], [place], [place] [address] [phone] M-F: 8:00-5:00, Sat: 8:00 - noon Medicaid - call; Tricare -yes  [place] [facility] Providers  [facility] Collins, FNP-C [address], [place], [place] [address] [phone] M-W: 8:00-5:00, Thur: 8:00 - 7:00, Fri: 8:00 - noon Medicaid - yes; Tricare - yes  [facility] [place] Adams, FNP [address], [place],   [place] [address] [phone] M-F 8:00 - 5:00, Closed for lunch 12-1 Medicaid -  yes; Tricare - yes  [place] [facility] Providers  [facility] Smith, FNP, Melvin, MD, Fay, FNP-C [address] [place] [address] [place], [place], [place] [address] [phone] M-T 8:00-5:00, Wed-Fri 7:00-6:00 Medicaid - Yes; Tricare -yes  [facility] [place] Civiletti, DO; [address], [place], [place] [address] [phone] M-F 8:00 - 5:00, closed for lunch 12-1 Medicaid - Yes; Tricare - yes  [facility]  Barnes, MD; Bergdolt, MD; Caulfield, MD; Emrich, MD; Fiscus, MD; Hoppens, MD; Kylstra, MD, McPherson, MD; Todd, MD; Prestwood, MD; Waters, MD; Wood, MD [address], [place], [place] [address] [phone] M-F 8:00-5:00 Medicaid - yes; Tricare - yes  [facility] Cheema, MD (speaks Punjabi and Hindi) [address] [place], [place] [address] [phone] M-F: 8:30 - 5:00, closed 12:30 - 1 for lunch Medicaid - Yes; Tricare -yes  [place] [facility] Providers  [facility] Loda, MD; Timberlake, MD; Burke, MD [address], [place], [place] [address] [phone] M-Th: 8:00 - 5:30, Fri: 8:00 - 12:00 Medicaid - yes; Tricare - yes  [facility] [place] Lookabill, NP; Meier, MD; Daffron, MD [address], [place], [place] [address] [phone] M-F: 8:00 - 5:00 Medicaid - yes; Tricare - yes  [facility] Busse, NP; Bowman, NP; Baune, NP; Entwistle, MD; Williams, MD, Huffman, NP, Ferguson, MD; Patel, DO [address], [place], [place] [address] [phone] M-F: 8:30 - 5:30p Medicaid - yes; Tricare - yes Other locations available as well  [facility] Rajan, MD; Wilson, MD; Morgan, PA-C, Domenech, PA-C; Myers, PA-C [address], [place], [place] [address] [phone] M-W: 8:00am - 7:00pm, Thurs: 8:00am - 8:00pm; Fri: 8:00am -  5:00pm, closed daily from 12-1 for lunch Medicaid - yes; Tricare - yes  [place] [facility] Providers  [facility] [place] Adams, MD; Crystal, FNP; Hadley, MD; Stokes, MD; Johnson, PNP; [place], PA-C; West, PNP; Gardner, MD;  [address], [place], [place] [address] [phone] M - Fri: 8am - 5pm, Sat 9-noon Medicaid - Yes; Tricare -yes  [facility] [place] Nayak, MD; Jones, FNP; McDonald, MD; Kearns, MD [address] [place] [address], [place], [place] [phone] M-F 8:00 - 5:00 Medicaid - call; Tricare - yes  [facility] [place] Bell, MD; Emory, PNP; Pinder, MD; Anderson, MD; Light, PA-C; Johnson, MD; Latta, MD; Saul, PNP; Rainey, MD; Clifford, MD; McClung, MD [address], [place], [place] [address] [phone] M-F 8:00am - 5:00pm; Sat. 9:00 - 11:00 Medicaid - yes; Tricare - yes  [facility] [place] Soldato-Couture, MD [address], [place], [place] [address] [phone] M-F 8:00 - 5:00 Medicaid - [place] Medicaid only; Tricare - yes  [facility] [place] Walker, MD; Davis, PNP; Ajizian, MD [address] [place] [address], [place], [place] [address] [phone] M-F 8:00 - 5:00 Medicaid - yes; Tricare - yes  [facility] [place] Barry, MD; Brown, MD, Forest, MD, Hazek, MD; Hoyle, MD; Smith, MD; [address], [place], [place] [address] [phone] M-F: 8-5 Medicaid - yes; Tricare - yes  [facility] [place], Md; Dowlen, MD; [address] [place] [address], [place],  [address] [phone] M-F 8-5 Medicaid - yes; Tricare - yes  [facility] Kearns, MD; Nayak, MD;   Sold[place]-Courture, MD; Pellam-Palmer, DNP; Herring, PNP [address], [place], [place] [address] [phone] M-F 8-5 Medicaid - yes; Tricare - yes  [facility] Weathers, MD;  Merritt, PA-C; Davis-PA-C; Warnimont, MD [address], [place], [place] [address] [phone] M-F 7am - 5 pm Medicaid - call; Tricare - yes  [facility] Hill, PNP; Erickson, MD; Robinson, MD [address], [place], [place] [address] [phone] M-F 8-5 Medicaid - yes; Tricare - yes  [facility] Kribbs, MD; Warner, MD; Williams, FNP; Brooks, FNP; Boles, FNP; Romblad, PA-C; [place] - FNP [address], [place], [place] [address] [phone] M-F 8-5 Medicaid- yes; Tricare - yes  [facility] [place] [place] [place] [facility] [place] Yoder, MD; Verenes, MD; Armentrout, MD; Stewart, MD; Beasley, CPNP; Ford, MD; Erickson, MD; Rice, MD [address], [place], [place] [address] [phone] M-F: 8-5, Sat: 9-4, Sun 9-12 Medicaid - yes; Tricare - yes  [facility] Little, PNP; Davis, PNP [address], [place], [place] [address] [phone] M-F 8 - 5, closed 12-1 for lunch Medicaid - yes; Tricare - yes  [facility] Friesen, MD; Cnegia, MD; Rice, MD; Patel, DO [address], [place], [place] [address] [phone] M-F 8- 5:30 Medicaid - yes; Tricare - yes  [facility] [place] Zvolensky, MD; Ray, MD; Haas, MD [address] [address], [place], [place] [address] [phone] M: 8-7; Tues-Fri: 8-5; Sat: 9-12 Medicaid - yes; Tricare - yes  [facility] [place] Heinrich, MD; Meyer, MD; Clark, MD; Rhyne, MD; Aubuchon, MD [address], [place], [place] [address] [phone] M: 8-7; Tues-Fri: 8-5; Sat: 8:30-12:30 Medicaid - yes; Tricare - yes  [facility] [place] Bista, MD; Dillard, PA [address], [place], [place] [address] [phone] Mon-Fri: 8-5 Medicaid - yes;  Tricare - yes  [facility] [place] Beasley, CPNP; Mahle, CPNP; Rice, MD; Duffy, MD; Culler, MD; [address], [place], [place] [address] [phone] M-F: 8-5, closed 1-2 for lunch Medicaid - yes; Tricare - yes  [facility] Rickman, PA; Mounce, NP; Smith, MD; Jordan, CPNP; Darty, PA; Ball, MD; Wallace, MD [address] [place] [address], [place], [place] [address] [phone] M-Thurs: 8-7; Fri: 8-6; Sat: 9-12; Sun 2-4 Medicaid - yes; Tricare - yes  [facility] Brown, MD; Shin, MD; Goodman, DNP, FNP; Sebesta, DO; [address], [place], [place] [address] [phone] M-F: 8-5 Medicaid - yes; Tricare - yes  [place] [facility] Providers  [facility] Dough, MD; Welsh, NP [address], [place], [place] [address] [phone] M - Fri: 8am - 5pm, closed for lunch 12-1 Medicaid - Yes; Tricare - yes  [facility] Manandhar, MD; Riley, MD; Sanger, DO; Vinocur, MD;Hall, PA; Walsh, PA; Campbell, NP [address], [place] [address] [phone] M-F 8:00 - 5:00, Sat 8:00 - 11:30 Medicaid - yes; Tricare - yes  [facility] Khan, MD; Redding, MD, Street, MD, Holt, MD, Burgart, MD; Rhyne, NP; Dickinson, PA;  [address] [place] [address], [place], [place] [address] [phone] M-F 8:10am - 5:00pm Medicaid - yes; Tricare - yes    [facility] Connors, MD; Kime, NP [address] [place] [address], [place], [place] [address] [phone] M-F 8:00 - 5:00 Medicaid - [place] Medicaid only; Tricare - yes  [facility] [place] Whyte, MD; Fox, NP [address], [place], [place] [address] [phone] M-F 8:00 - 5:00; Closed for lunch 12 - 1:00 Medicaid - yes;  Tricare - yes  [facility] Penner, MD; Wilburn, FNP [address], [place], [place] [address] [phone] Mon 9-5; Tues/Wed 10-5; Thurs 8:30-5; Fri: 8-12:30 Medicaid - yes; Tricare - yes  [place] [facility] Providers  [facility]  Golding, MD; Jackson, PA-C [address], [place], [place] [address] [phone] phone [phone] fax M-F 7:15 - 4:30 Medicaid - yes; Tricare - yes  [facility] [place] [facility] Gosrani, MD; Meccariello, DO [address] [place], [place] [address] [phone] M-Fri: 8:30 - 5:00, closed for lunch everyday noon - 1pm Medicaid - Yes; Tricare - yes  [facility] Burdine, MD; Daniel, MD; Howard, MD; Sasser, MD; Boles, PA; Boyd, PA-C; Carroll, PA; McGee, PA; Skillman, PA; Wilson, PA [address] [place], [place] [address] [phone] M-Thurs: 7:30am - 7:00pm; Friday 7:30am - 4pm; Sat: 8:00 - 1:00 Medicaid - Yes; Tricare - yes  [facility] [place] Akhbari, MD; Law, MD; Qayumi, MD; Salvador, DO [address], [place], [place] [address] [phone] M-Thur: 8:00 - 5:00, Fri: 8:00 - Noon Medicaid - yes; Tricare - yes No [place] [employer]  [facility] [place] [facility] Dettinger, MD; Gottschalk, DO; Hawks, NP; Martin, NP; Morgan, NP; Milian, NP; Rakes, NP; Stacks, MD; Webster, PA [address] [place], [place] [address] [phone] M-F 8:00 - 5:00 Medicaid - yes; Tricare - yes  [facility] Collins, FNP-C; Bucio, FNP-C [address], [place],  [address] [phone] M, W, R 8:00-5:00, Tues: 8:00am - 7:00pm; Fri 8:00 - noon Medicaid - Yes; Tricare - yes  [facility] Khan, MD [address] [place] [address] [place],  [address] [phone]  M-Thurs 8:30-5:30, Fri: 8:30-12:30pm Medicaid - Yes; Tricare - N     Safe Medications in Pregnancy   Acne:  Benzoyl Peroxide  Salicylic Acid    Backache/Headache:  Tylenol: 2 regular strength every 4 hours OR               2 Extra strength every 6 hours   Colds/Coughs/Allergies:  Benadryl (alcohol free) 25 mg every 6 hours as needed  Breath right strips  Claritin  Cepacol throat lozenges  Chloraseptic throat spray  Cold-Eeze- up to three times per day  Cough drops, alcohol free  Flonase (by prescription only)  Guaifenesin  Mucinex  Robitussin DM (plain only, alcohol free)  Saline nasal spray/drops  Sudafed (pseudoephedrine) & Actifed * use only after [redacted] weeks gestation and if you do not have high blood pressure  Tylenol  Vicks Vaporub  Zinc lozenges  Zyrtec   Constipation:  Colace  Ducolax suppositories  Fleet enema  Glycerin suppositories  Metamucil  Milk of magnesia  Miralax  Senokot  Smooth move tea   Diarrhea:  Kaopectate  Imodium A-D   *NO pepto Bismol   Hemorrhoids:  Anusol  Anusol HC  Preparation H  Tucks   Indigestion:  Tums  Maalox  Mylanta  Zantac  Pepcid   Insomnia:  Benadryl (alcohol free) 25mg every 6 hours as needed  Tylenol PM    Unisom, no Gelcaps   Leg Cramps:  Tums  MagGel   Nausea/Vomiting:  Bonine  Dramamine  Emetrol  Ginger extract  Sea bands  Meclizine  Nausea medication to take during pregnancy:  Unisom (doxylamine succinate 25 mg tablets) Take one tablet daily at bedtime. If symptoms are not adequately controlled, the dose can be increased to a maximum recommended dose of two tablets daily (1/2 tablet in the morning, 1/2 tablet mid-afternoon and one at bedtime).  Vitamin B6 100mg tablets. Take one tablet twice a day (up to 200 mg per day).   Skin Rashes:  Aveeno products  Benadryl cream or 25mg every 6 hours as needed  Calamine Lotion  1% cortisone cream   Yeast infection:  Gyne-lotrimin 7  Monistat 7    **If taking multiple medications, please check labels to avoid duplicating the same active ingredients  **take medication as directed on the label   ** Do not exceed 4000 mg of tylenol in 24 hours  **Do not take medications that contain aspirin or ibuprofen           

## 2023-02-03 NOTE — Progress Notes (Signed)
New OB Intake  I connected with Kelly Lopez  on 02/03/23 at  1:15 PM EDT by MyChart Video Visit and verified that I am speaking with the correct person using two identifiers. Nurse is located at [facility] and pt is located at work.  I discussed the limitations, risks, security and privacy concerns of performing an evaluation and management service by telephone and the availability of in person appointments. I also discussed with the patient that there may be a patient responsible charge related to this service. The patient expressed understanding and agreed to proceed.  I explained I am completing New OB Intake today. We discussed EDD of 07/11/23 that is based on LMP of 10/04/22. Pt is G6/P0. I reviewed her allergies, medications, Medical/Surgical/OB history, and appropriate screenings. I informed her of [facility] services. [facility] information placed in AVS. Based on history, this is a high risk pregnancy.  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 Patient given information for [facility] Healthy Baby website for more information about [facility]. Patient is not interested in water birth. Offered upcoming OB visit with CNM to discuss further.  MyChart/Babyscripts MyChart access verified. I explained pt will have some visits in office and some virtually. Babyscripts instructions given and order placed.  Patient verifies receipt of registration text/e-mail. Account successfully created and app downloaded.  Blood Pressure Cuff/Weight Scale Pt works @ [employer], will use theres, Explained after first prenatal appt pt will check weekly and document in Babyscripts. Patient does have weight scale.  Anatomy US Explained first scheduled [place] will be around 19 weeks. Anatomy US scheduled for 03/09/23 at 0745a. Pt notified to arrive at 0730a.  Labs Discussed Avelina Laine genetic screening with patient. Would like both Panorama and Horizon drawn at new OB visit. Routine prenatal labs needed.Pt already had New OB Labs done at [facility].  COVID Vaccine Patient has had COVID vaccine.   Is patient a CenteringPregnancy candidate?        Is patient a Mom+Baby Combined Care candidate?  Not a candidate   If accepted, Mom+Baby staff notified  Social Determinants of Health Food Insecurity: Patient denies food insecurity. WIC Referral: Patient is interested in referral to [facility].  Transportation: Patient denies transportation needs. Childcare: Discussed no children allowed at ultrasound appointments. Offered childcare services; patient declines childcare services at this time.  Interested in [place]? If yes, send referral and doula dot phrase.   First visit review I reviewed new OB appt with patient. Explained pt will be seen by Dr. Crissie Reese at first visit; encounter routed to appropriate provider. Explained that patient will be seen by pregnancy navigator following visit with provider.   Henrietta Dine, CMA 02/03/2023  1:53 PM

## 2023-02-10 ENCOUNTER — Encounter: Payer: Self-pay | Admitting: Family Medicine

## 2023-02-10 ENCOUNTER — Other Ambulatory Visit: Payer: Self-pay

## 2023-02-10 ENCOUNTER — Ambulatory Visit (INDEPENDENT_AMBULATORY_CARE_PROVIDER_SITE_OTHER): Payer: No Typology Code available for payment source | Admitting: Family Medicine

## 2023-02-10 VITALS — BP 105/68 | HR 86 | Wt 198.0 lb

## 2023-02-10 DIAGNOSIS — O0990 Supervision of high risk pregnancy, unspecified, unspecified trimester: Secondary | ICD-10-CM

## 2023-02-10 DIAGNOSIS — Z8759 Personal history of other complications of pregnancy, childbirth and the puerperium: Secondary | ICD-10-CM

## 2023-02-10 DIAGNOSIS — O09522 Supervision of elderly multigravida, second trimester: Secondary | ICD-10-CM

## 2023-02-10 DIAGNOSIS — F3341 Major depressive disorder, recurrent, in partial remission: Secondary | ICD-10-CM

## 2023-02-10 DIAGNOSIS — O0992 Supervision of high risk pregnancy, unspecified, second trimester: Secondary | ICD-10-CM | POA: Diagnosis not present

## 2023-02-10 DIAGNOSIS — Z3A18 18 weeks gestation of pregnancy: Secondary | ICD-10-CM

## 2023-02-10 MED ORDER — ASPIRIN 81 MG PO TBEC: 81.0000 mg | DELAYED_RELEASE_TABLET | Freq: Every day | ORAL | 12 refills | Status: DC

## 2023-02-10 NOTE — Progress Notes (Signed)
Subjective:   Kelly Lopez is a 42 y.o. G6P0140 at [redacted]w[redacted]d by early ultrasound being seen today for her first obstetrical visit.  Her obstetrical history is significant for advanced maternal age and history of two prior pregnancies delivered in 2 trimester with fetal anomalies . Patient does intend to breast feed. Pregnancy history fully reviewed.  Patient reports no complaints.  HISTORY: OB History  Gravida Para Term Preterm AB Living  6 1 0 1 4 0  SAB IAB Ectopic Multiple Live Births  2 1 0 0 0    # Outcome Date GA Lbr Len/2nd Weight Sex Delivery Anes PTL Lv  6 Current           5 SAB 08/2022          4 AB 03/01/22 [redacted]w[redacted]d  8.2 oz (0.231 kg) M Vag-Spont EPI  FD     Name: THIDA, BARRACLOUGH FD  3 Preterm 08/31/19 [redacted]w[redacted]d  15.2 oz (0.43 kg) F Vag-Spont EPI  FD     Birth Comments: multiple anomalies     Complications: Turner's syndrome, Fetal hydrops     Name: Gulotta,PENDINGBABY FD     Apgar1: 0  Apgar5: 0  2 SAB 2020          1 IAB 2000             Last pap smear: No results found for: "DIAGPAP", "HPV", "HPVHIGH" 01/13/2022 - NILM, neg HPV  Past Medical History:  Diagnosis Date   Anxiety    Depression    Esophageal dysphagia    Psoriasis    SVD (spontaneous vaginal delivery) 03/01/2022   Past Surgical History:  Procedure Laterality Date   DILATION AND EVACUATION N/A 08/31/2019   Procedure: DILATATION AND EVACUATION;  Surgeon: Tereso Newcomer, MD;  Location: [facility] LD ORS;  Service: Gynecology;  Laterality: N/A;   NO PAST SURGERIES     Family History  Problem Relation Age of Onset   Cancer Maternal Grandmother    Cancer Paternal Grandmother    Social History   Tobacco Use   Smoking status: Former    Types: Cigars   Smokeless tobacco: Never  Vaping Use   Vaping Use: Never used  Substance Use Topics   Alcohol use: Not Currently   Drug use: Not Currently   Allergies  Allergen Reactions   Sertraline Hcl Other (See Comments)   Ferrous Sulfate Rash    Current Outpatient Medications on File Prior to Visit  Medication Sig Dispense Refill   Docusate Sodium (DSS) 100 MG CAPS Take 2 capsules by mouth daily.     omeprazole (PRILOSEC) 20 MG capsule Take 20 mg by mouth daily.     prenatal vitamin w/FE, FA (PRENATAL 1 + 1) 27-1 MG TABS tablet Take 1 tablet by mouth daily at 12 noon.     valACYclovir (VALTREX) 500 MG tablet Take 500 mg by mouth daily.     venlafaxine XR (EFFEXOR-XR) 75 MG 24 hr capsule Take 75 mg by mouth every morning.     No current facility-administered medications on file prior to visit.     Exam   Vitals:   02/10/23 1552  BP: 105/68  Pulse: 86  Weight: 198 lb (89.8 kg)   Fetal Heart Rate (bpm): 150  System: General: well-developed, well-nourished female in no acute distress   Skin: normal coloration and turgor, no rashes   Neurologic: oriented, normal, negative, normal mood   Extremities: normal strength, tone, and muscle mass, ROM  of all joints is normal   HEENT PERRLA, extraocular movement intact and sclera clear, anicteric   Neck supple and no masses   Respiratory:  no respiratory distress      Assessment:   Pregnancy: G6P0140 Patient Active Problem List   Diagnosis Date Noted   Supervision of high risk pregnancy, antepartum 02/03/2023   AMA (advanced maternal age) multigravida 35+ 02/03/2023   HSV-1 (herpes simplex virus 1) infection 02/03/2023   Susceptible to varicella (non-immune), currently pregnant 01/04/2023   Fetal anomaly necessitating delivery 02/28/2022   Major depressive disorder, recurrent, in partial remission (HCC) 06/19/2020   History of fetal loss 09/08/2019   History of retained placenta 08/31/2019   Obesity 07/11/2019   History of miscarriage, currently pregnant, first trimester 06/19/2019     Plan:  1. Supervision of high risk pregnancy, antepartum BP and FHR normal Initial labs available in Care Everywhere, all normal including genetics. Continue prenatal  vitamins. Genetic Screening discussed, NIPS: results reviewed. Normal Materni21 result in Care Everywhere. Ultrasound discussed; fetal anatomic survey: ordered. Problem list reviewed and updated. The nature of [facility] with multiple MDs and other Advanced Practice Providers was explained to patient; also emphasized that residents, students are part of our team.  2. 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 Multigravida of advanced maternal age in second trimester Start ASA  4. 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 History of retained placenta After delivery in 2020 for which I was present and assisted in the D&E Delivery was at 21 weeks No problems with second delivery at 17 weeks  Routine obstetric precautions reviewed. Return in 4 weeks (on 03/10/2023) for [facility], ob visit.

## 2023-02-10 NOTE — Patient Instructions (Signed)
Second Trimester of Pregnancy  The second trimester of pregnancy is from week 13 through week 27. This is months 4 through 6 of pregnancy. The second trimester is often a time when you feel your best. Your body has adjusted to being pregnant, and you begin to feel better physically. During the second trimester: Morning sickness has lessened or stopped completely. You may have more energy. You may have an increase in appetite. The second trimester is also a time when the unborn baby (fetus) is growing rapidly. At the end of the sixth month, the fetus may be up to 12 inches long and weigh about 1 pounds. You will likely begin to feel the baby move (quickening) between 16 and 20 weeks of pregnancy. Body changes during your second trimester Your body continues to go through many changes during your second trimester. The changes vary and generally return to normal after the baby is born. Physical changes Your weight will continue to increase. You will notice your lower abdomen bulging out. You may begin to get stretch marks on your hips, abdomen, and breasts. Your breasts will continue to grow and to become tender. Dark spots or blotches (chloasma or mask of pregnancy) may develop on your face. A dark line from your belly button to the pubic area (linea nigra) may appear. You may have changes in your hair. These can include thickening of your hair, rapid growth, and changes in texture. Some people also have hair loss during or after pregnancy, or hair that feels dry or thin. Health changes You may develop headaches. You may have heartburn. You may develop constipation. You may develop hemorrhoids or swollen, bulging veins (varicose veins). Your gums may bleed and may be sensitive to brushing and flossing. You may urinate more often because the fetus is pressing on your bladder. You may have back pain. This is caused by: Weight gain. Pregnancy hormones that are relaxing the joints in your  pelvis. A shift in weight and the muscles that support your balance. Follow these instructions at home: Medicines Follow your health care provider's instructions regarding medicine use. Specific medicines may be either safe or unsafe to take during pregnancy. Do not take any medicines unless approved by your health care provider. Take a prenatal vitamin that contains at least 600 micrograms (mcg) of folic acid. Eating and drinking Eat a healthy diet that includes fresh fruits and vegetables, whole grains, good sources of protein such as meat, eggs, or tofu, and low-fat dairy products. Avoid raw meat and unpasteurized juice, milk, and cheese. These carry germs that can harm you and your baby. You may need to take these actions to prevent or treat constipation: Drink enough fluid to keep your urine pale yellow. Eat foods that are high in fiber, such as beans, whole grains, and fresh fruits and vegetables. Limit foods that are high in fat and processed sugars, such as fried or sweet foods. Activity Exercise only as directed by your health care provider. Most people can continue their usual exercise routine during pregnancy. Try to exercise for 30 minutes at least 5 days a week. Stop exercising if you develop contractions in your uterus. Stop exercising if you develop pain or cramping in the lower abdomen or lower back. Avoid exercising if it is very hot or humid or if you are at a high altitude. Avoid heavy lifting. If you choose to, you may have sex unless your health care provider tells you not to. Relieving pain and discomfort Wear a supportive  bra to prevent discomfort from breast tenderness. Take warm sitz baths to soothe any pain or discomfort caused by hemorrhoids. Use hemorrhoid cream if your health care provider approves. Rest with your legs raised (elevated) if you have leg cramps or low back pain. If you develop varicose veins: Wear support hose as told by your health care  provider. Elevate your feet for 15 minutes, 3-4 times a day. Limit salt in your diet. Safety Wear your seat belt at all times when driving or riding in a car. Talk with your health care provider if someone is verbally or physically abusive to you. Lifestyle Do not use hot tubs, steam rooms, or saunas. Do not douche. Do not use tampons or scented sanitary pads. Avoid cat litter boxes and soil used by cats. These carry germs that can cause birth defects in the baby and possibly loss of the fetus by miscarriage or stillbirth. Do not use herbal remedies, alcohol, illegal drugs, or medicines that are not approved by your health care provider. Chemicals in these products can harm your baby. Do not use any products that contain nicotine or tobacco, such as cigarettes, e-cigarettes, and chewing tobacco. If you need help quitting, ask your health care provider. General instructions During a routine prenatal visit, your health care provider will do a physical exam and other tests. He or she will also discuss your overall health. Keep all follow-up visits. This is important. Ask your health care provider for a referral to a local prenatal education class. Ask for help if you have counseling or nutritional needs during pregnancy. Your health care provider can offer advice or refer you to specialists for help with various needs. Where to find more information American Pregnancy Association: americanpregnancy.org [employer]ians and Gynecologists: https://www.todd-brady.net/ Office on [employer] Health: MightyReward.co.nz Contact a health care provider if you have: A headache that does not go away when you take medicine. Vision changes or you see spots in front of your eyes. Mild pelvic cramps, pelvic pressure, or nagging pain in the abdominal area. Persistent nausea, vomiting, or diarrhea. A bad-smelling vaginal discharge or foul-smelling urine. Pain when you  urinate. Sudden or extreme swelling of your face, hands, ankles, feet, or legs. A fever. Get help right away if you: Have fluid leaking from your vagina. Have spotting or bleeding from your vagina. Have severe abdominal cramping or pain. Have difficulty breathing. Have chest pain. Have fainting spells. Have not felt your baby move for the time period told by your health care provider. Have new or increased pain, swelling, or redness in an arm or leg. Summary The second trimester of pregnancy is from week 13 through week 27 (months 4 through 6). Do not use herbal remedies, alcohol, illegal drugs, or medicines that are not approved by your health care provider. Chemicals in these products can harm your baby. Exercise only as directed by your health care provider. Most people can continue their usual exercise routine during pregnancy. Keep all follow-up visits. This is important. This information is not intended to replace advice given to you by your health care provider. Make sure you discuss any questions you have with your health care provider. Document Revised: 02/13/2020 Document Reviewed: 12/20/2019 Elsevier Patient Education  2024 [employer].  Contraception Choices Contraception, also called birth control, refers to methods or devices that prevent pregnancy. Hormonal methods  Contraceptive implant A contraceptive implant is a thin, plastic tube that contains a hormone that prevents pregnancy. It is different from an intrauterine device (  IUD). It is inserted into the upper part of the arm by a health care provider. Implants can be effective for up to 3 years. Progestin-only injections Progestin-only injections are injections of progestin, a synthetic form of the hormone progesterone. They are given every 3 months by a health care provider. Birth control pills Birth control pills are pills that contain hormones that prevent pregnancy. They must be taken once a day, preferably at  the same time each day. A prescription is needed to use this method of contraception. Birth control patch The birth control patch contains hormones that prevent pregnancy. It is placed on the skin and must be changed once a week for three weeks and removed on the fourth week. A prescription is needed to use this method of contraception. Vaginal ring A vaginal ring contains hormones that prevent pregnancy. It is placed in the vagina for three weeks and removed on the fourth week. After that, the process is repeated with a new ring. A prescription is needed to use this method of contraception. Emergency contraceptive Emergency contraceptives prevent pregnancy after unprotected sex. They come in pill form and can be taken up to 5 days after sex. They work best the sooner they are taken after having sex. Most emergency contraceptives are available without a prescription. This method should not be used as your only form of birth control. Barrier methods  Female condom A female condom is a thin sheath that is worn over the penis during sex. Condoms keep sperm from going inside a woman's body. They can be used with a sperm-killing substance (spermicide) to increase their effectiveness. They should be thrown away after one use. Female condom A female condom is a soft, loose-fitting sheath that is put into the vagina before sex. The condom keeps sperm from going inside a woman's body. They should be thrown away after one use. Diaphragm A diaphragm is a soft, dome-shaped barrier. It is inserted into the vagina before sex, along with a spermicide. The diaphragm blocks sperm from entering the uterus, and the spermicide kills sperm. A diaphragm should be left in the vagina for 6-8 hours after sex and removed within 24 hours. A diaphragm is prescribed and fitted by a health care provider. A diaphragm should be replaced every 1-2 years, after giving birth, after gaining more than 15 lb (6.8 kg), and after pelvic  surgery. Cervical cap A cervical cap is a round, soft latex or plastic cup that fits over the cervix. It is inserted into the vagina before sex, along with spermicide. It blocks sperm from entering the uterus. The cap should be left in place for 6-8 hours after sex and removed within 48 hours. A cervical cap must be prescribed and fitted by a health care provider. It should be replaced every 2 years. Sponge A sponge is a soft, circular piece of polyurethane foam with spermicide in it. The sponge helps block sperm from entering the uterus, and the spermicide kills sperm. To use it, you make it wet and then insert it into the vagina. It should be inserted before sex, left in for at least 6 hours after sex, and removed and thrown away within 30 hours. Spermicides Spermicides are chemicals that kill or block sperm from entering the cervix and uterus. They can come as a cream, jelly, suppository, foam, or tablet. A spermicide should be inserted into the vagina with an applicator at least 10-15 minutes before sex to allow time for it to work. The process must be  repeated every time you have sex. Spermicides do not require a prescription. Intrauterine contraception Intrauterine device (IUD) An IUD is a T-shaped device that is put in a woman's uterus. There are two types: Hormone IUD.This type contains progestin, a synthetic form of the hormone progesterone. This type can stay in place for 3-5 years. Copper IUD.This type is wrapped in copper wire. It can stay in place for 10 years. Permanent methods of contraception Female tubal ligation In this method, a woman's fallopian tubes are sealed, tied, or blocked during surgery to prevent eggs from traveling to the uterus. Hysteroscopic sterilization In this method, a small, flexible insert is placed into each fallopian tube. The inserts cause scar tissue to form in the fallopian tubes and block them, so sperm cannot reach an egg. The procedure takes about 3  months to be effective. Another form of birth control must be used during those 3 months. Female sterilization This is a procedure to tie off the tubes that carry sperm (vasectomy). After the procedure, the man can still ejaculate fluid (semen). Another form of birth control must be used for 3 months after the procedure. Natural planning methods Natural family planning In this method, a couple does not have sex on days when the woman could become pregnant. Calendar method In this method, the woman keeps track of the length of each menstrual cycle, identifies the days when pregnancy can happen, and does not have sex on those days. Ovulation method In this method, a couple avoids sex during ovulation. Symptothermal method This method involves not having sex during ovulation. The woman typically checks for ovulation by watching changes in her temperature and in the consistency of cervical mucus. Post-ovulation method In this method, a couple waits to have sex until after ovulation. Where to find more information Centers for Disease Control and Prevention: FootballExhibition.com.br Summary Contraception, also called birth control, refers to methods or devices that prevent pregnancy. Hormonal methods of contraception include implants, injections, pills, patches, vaginal rings, and emergency contraceptives. Barrier methods of contraception can include female condoms, female condoms, diaphragms, cervical caps, sponges, and spermicides. There are two types of IUDs (intrauterine devices). An IUD can be put in a woman's uterus to prevent pregnancy for 3-5 years. Permanent sterilization can be done through a procedure for males and females. Natural family planning methods involve nothaving sex on days when the woman could become pregnant. This information is not intended to replace advice given to you by your health care provider. Make sure you discuss any questions you have with your health care provider. Document  Revised: 02/11/2020 Document Reviewed: 02/11/2020 Elsevier Patient Education  2024 [employer].

## 2023-02-16 ENCOUNTER — Encounter: Payer: Self-pay | Admitting: *Deleted

## 2023-03-04 DIAGNOSIS — Z148 Genetic carrier of other disease: Secondary | ICD-10-CM | POA: Insufficient documentation

## 2023-03-09 ENCOUNTER — Ambulatory Visit: Payer: No Typology Code available for payment source | Attending: Family Medicine

## 2023-03-09 ENCOUNTER — Other Ambulatory Visit: Payer: Self-pay | Admitting: *Deleted

## 2023-03-09 ENCOUNTER — Ambulatory Visit: Payer: No Typology Code available for payment source | Admitting: *Deleted

## 2023-03-09 ENCOUNTER — Encounter: Payer: Self-pay | Admitting: *Deleted

## 2023-03-09 VITALS — BP 104/64 | HR 83

## 2023-03-09 DIAGNOSIS — O09522 Supervision of elderly multigravida, second trimester: Secondary | ICD-10-CM

## 2023-03-09 DIAGNOSIS — O285 Abnormal chromosomal and genetic finding on antenatal screening of mother: Secondary | ICD-10-CM | POA: Diagnosis not present

## 2023-03-09 DIAGNOSIS — Z148 Genetic carrier of other disease: Secondary | ICD-10-CM | POA: Insufficient documentation

## 2023-03-09 DIAGNOSIS — O0990 Supervision of high risk pregnancy, unspecified, unspecified trimester: Secondary | ICD-10-CM

## 2023-03-09 DIAGNOSIS — B009 Herpesviral infection, unspecified: Secondary | ICD-10-CM | POA: Insufficient documentation

## 2023-03-09 DIAGNOSIS — O99212 Obesity complicating pregnancy, second trimester: Secondary | ICD-10-CM

## 2023-03-09 DIAGNOSIS — O09292 Supervision of pregnancy with other poor reproductive or obstetric history, second trimester: Secondary | ICD-10-CM

## 2023-03-09 DIAGNOSIS — O98512 Other viral diseases complicating pregnancy, second trimester: Secondary | ICD-10-CM

## 2023-03-09 DIAGNOSIS — Z3A22 22 weeks gestation of pregnancy: Secondary | ICD-10-CM

## 2023-03-09 DIAGNOSIS — Z362 Encounter for other antenatal screening follow-up: Secondary | ICD-10-CM

## 2023-03-10 ENCOUNTER — Other Ambulatory Visit: Payer: Self-pay

## 2023-03-10 ENCOUNTER — Ambulatory Visit: Payer: No Typology Code available for payment source | Admitting: Obstetrics and Gynecology

## 2023-03-10 ENCOUNTER — Encounter: Payer: No Typology Code available for payment source | Admitting: Obstetrics and Gynecology

## 2023-03-10 VITALS — BP 107/71 | HR 103 | Wt 201.1 lb

## 2023-03-10 DIAGNOSIS — O0992 Supervision of high risk pregnancy, unspecified, second trimester: Secondary | ICD-10-CM

## 2023-03-10 DIAGNOSIS — F3341 Major depressive disorder, recurrent, in partial remission: Secondary | ICD-10-CM

## 2023-03-10 DIAGNOSIS — O0990 Supervision of high risk pregnancy, unspecified, unspecified trimester: Secondary | ICD-10-CM

## 2023-03-10 DIAGNOSIS — B009 Herpesviral infection, unspecified: Secondary | ICD-10-CM

## 2023-03-10 DIAGNOSIS — Z3A22 22 weeks gestation of pregnancy: Secondary | ICD-10-CM

## 2023-03-10 DIAGNOSIS — O09522 Supervision of elderly multigravida, second trimester: Secondary | ICD-10-CM

## 2023-03-10 DIAGNOSIS — Z8759 Personal history of other complications of pregnancy, childbirth and the puerperium: Secondary | ICD-10-CM

## 2023-03-10 MED ORDER — VALACYCLOVIR HCL 500 MG PO TABS: 500.0000 mg | ORAL_TABLET | Freq: Every day | ORAL | 4 refills | Status: DC

## 2023-03-10 NOTE — Progress Notes (Addendum)
   PRENATAL VISIT NOTE  Subjective:  Kelly Lopez is a 42 y.o. G6P0140 at [redacted]w[redacted]d being seen today for ongoing prenatal care.  She is currently monitored for the following issues for this high-risk pregnancy and has Obesity; History of fetal loss; Supervision of high risk pregnancy, antepartum; AMA (advanced maternal age) multigravida 35+; HSV-1 (herpes simplex virus 1) infection; Susceptible to varicella (non-immune), currently pregnant; Major depressive disorder, recurrent, in partial remission (HCC); and Alpha-1-antitrypsin deficiency carrier on their problem list.  Patient reports no complaints.  Contractions: Not present. Vag. Bleeding: None.  Movement: Present. Denies leaking of fluid.   The following portions of the patient's history were reviewed and updated as appropriate: allergies, current medications, past family history, past medical history, past social history, past surgical history and problem list.   Objective:   Vitals:   03/10/23 0929  BP: 107/71  Pulse: (!) 103  Weight: 201 lb 1.6 oz (91.2 kg)    Fetal Status: Fetal Heart Rate (bpm): 148   Movement: Present     General:  Alert, oriented and cooperative. Patient is in no acute distress.  Skin: Skin is warm and dry. No rash noted.   Cardiovascular: Normal heart rate noted  Respiratory: Normal respiratory effort, no problems with respiration noted  Abdomen: Soft, gravid, appropriate for gestational age.  Pain/Pressure: Absent     Pelvic: Cervical exam deferred        Extremities: Normal range of motion.  Edema: Trace  Mental Status: Normal mood and affect. Normal behavior. Normal judgment and thought content.   Assessment and Plan:  Pregnancy: G6P0140 at [redacted]w[redacted]d 1. Supervision of high risk pregnancy, antepartum BP and FHR normal  Feeling regular fetal movement 6/19 u/s wnl, will repeat 7/17   2. Multigravida of advanced maternal age in second trimester Continue ASA  3. Major depressive disorder, recurrent, in  partial remission (HCC) Doing well on Effexor daily   4. History of retained placenta 2020 delivery, D&E at 21 weeks   5. History of fetal loss 2020 [redacted]w[redacted]d,2023 [redacted]w[redacted]d IOL fetal anomalies NIPS current pregnancy    6. HSV-1 (herpes simplex virus 1) infection Takes daily, refill today - valACYclovir (VALTREX) 500 MG tablet; Take 1 tablet (500 mg total) by mouth daily.  Dispense: 30 tablet; Refill: 4   Preterm labor symptoms and general obstetric precautions including but not limited to vaginal bleeding, contractions, leaking of fluid and fetal movement were reviewed in detail with the patient. Please refer to After Visit Summary for other counseling recommendations. Return in 4 weeks for routine ob and 2hr GTT  Future Appointments  Date Time Provider Department Center  04/06/2023  1:30 PM [facility] NURSE [facility]  04/06/2023  1:45 PM [facility] US4 [facility]    Albertine Grates, FNP

## 2023-04-05 ENCOUNTER — Other Ambulatory Visit: Payer: Self-pay | Admitting: Lactation Services

## 2023-04-05 DIAGNOSIS — O0990 Supervision of high risk pregnancy, unspecified, unspecified trimester: Secondary | ICD-10-CM

## 2023-04-06 ENCOUNTER — Ambulatory Visit: Payer: No Typology Code available for payment source | Admitting: *Deleted

## 2023-04-06 ENCOUNTER — Ambulatory Visit: Payer: No Typology Code available for payment source | Attending: Obstetrics

## 2023-04-06 ENCOUNTER — Other Ambulatory Visit: Payer: Self-pay

## 2023-04-06 ENCOUNTER — Ambulatory Visit: Payer: No Typology Code available for payment source | Admitting: Obstetrics & Gynecology

## 2023-04-06 ENCOUNTER — Other Ambulatory Visit: Payer: Self-pay | Admitting: *Deleted

## 2023-04-06 ENCOUNTER — Other Ambulatory Visit: Payer: No Typology Code available for payment source

## 2023-04-06 VITALS — BP 107/73 | HR 98 | Wt 205.3 lb

## 2023-04-06 VITALS — BP 107/70 | HR 91

## 2023-04-06 DIAGNOSIS — O09292 Supervision of pregnancy with other poor reproductive or obstetric history, second trimester: Secondary | ICD-10-CM

## 2023-04-06 DIAGNOSIS — O0990 Supervision of high risk pregnancy, unspecified, unspecified trimester: Secondary | ICD-10-CM | POA: Insufficient documentation

## 2023-04-06 DIAGNOSIS — O09522 Supervision of elderly multigravida, second trimester: Secondary | ICD-10-CM | POA: Insufficient documentation

## 2023-04-06 DIAGNOSIS — O99212 Obesity complicating pregnancy, second trimester: Secondary | ICD-10-CM

## 2023-04-06 DIAGNOSIS — O98512 Other viral diseases complicating pregnancy, second trimester: Secondary | ICD-10-CM

## 2023-04-06 DIAGNOSIS — Z8759 Personal history of other complications of pregnancy, childbirth and the puerperium: Secondary | ICD-10-CM

## 2023-04-06 DIAGNOSIS — O09529 Supervision of elderly multigravida, unspecified trimester: Secondary | ICD-10-CM | POA: Insufficient documentation

## 2023-04-06 DIAGNOSIS — Z3A26 26 weeks gestation of pregnancy: Secondary | ICD-10-CM

## 2023-04-06 DIAGNOSIS — Z148 Genetic carrier of other disease: Secondary | ICD-10-CM

## 2023-04-06 DIAGNOSIS — B009 Herpesviral infection, unspecified: Secondary | ICD-10-CM | POA: Insufficient documentation

## 2023-04-06 DIAGNOSIS — Z362 Encounter for other antenatal screening follow-up: Secondary | ICD-10-CM | POA: Diagnosis present

## 2023-04-06 DIAGNOSIS — O0992 Supervision of high risk pregnancy, unspecified, second trimester: Secondary | ICD-10-CM

## 2023-04-06 DIAGNOSIS — O285 Abnormal chromosomal and genetic finding on antenatal screening of mother: Secondary | ICD-10-CM

## 2023-04-06 DIAGNOSIS — E669 Obesity, unspecified: Secondary | ICD-10-CM

## 2023-04-06 DIAGNOSIS — F3341 Major depressive disorder, recurrent, in partial remission: Secondary | ICD-10-CM

## 2023-04-06 NOTE — Progress Notes (Signed)
   PRENATAL VISIT NOTE  Subjective:  Kelly Lopez is a 42 y.o. G6P0140 at [redacted]w[redacted]d being seen today for ongoing prenatal care.  She is currently monitored for the following issues for this high-risk pregnancy and has Obesity; History of fetal loss; Supervision of high risk pregnancy, antepartum; AMA (advanced maternal age) multigravida 35+; HSV-1 (herpes simplex virus 1) infection; Susceptible to varicella (non-immune), currently pregnant; Major depressive disorder, recurrent, in partial remission (HCC); and Alpha-1-antitrypsin deficiency carrier on their problem list.  Patient reports no complaints.  Contractions: Not present. Vag. Bleeding: None.  Movement: Present. Denies leaking of fluid.   The following portions of the patient's history were reviewed and updated as appropriate: allergies, current medications, past family history, past medical history, past social history, past surgical history and problem list.   Objective:   Vitals:   04/06/23 0859  BP: 107/73  Pulse: 98  Weight: 205 lb 4.8 oz (93.1 kg)    Fetal Status: Fetal Heart Rate (bpm): 152   Movement: Present     General:  Alert, oriented and cooperative. Patient is in no acute distress.  Skin: Skin is warm and dry. No rash noted.   Cardiovascular: Normal heart rate noted  Respiratory: Normal respiratory effort, no problems with respiration noted  Abdomen: Soft, gravid, appropriate for gestational age.  Pain/Pressure: Present     Pelvic: Cervical exam deferred        Extremities: Normal range of motion.  Edema: Trace  Mental Status: Normal mood and affect. Normal behavior. Normal judgment and thought content.   Assessment and Plan:  Pregnancy: G6P0140 at [redacted]w[redacted]d 1. Supervision of high risk pregnancy, antepartum Needs 2 hr GTT  2. Multigravida of advanced maternal age in second trimester F/u US today  3. History of fetal loss   4. Major depressive disorder, recurrent, in partial remission (HCC)   Preterm labor  symptoms and general obstetric precautions including but not limited to vaginal bleeding, contractions, leaking of fluid and fetal movement were reviewed in detail with the patient. Please refer to After Visit Summary for other counseling recommendations.   Return in about 4 weeks (around 05/04/2023).  Future Appointments  Date Time Provider Department Center  04/06/2023  1:30 PM [facility] NURSE [facility]  04/06/2023  1:45 PM [facility] US4 [facility]  04/11/2023  8:20 AM [facility] LAB [facility]    Scheryl Darter, MD

## 2023-04-07 ENCOUNTER — Other Ambulatory Visit: Payer: No Typology Code available for payment source

## 2023-04-07 DIAGNOSIS — O0990 Supervision of high risk pregnancy, unspecified, unspecified trimester: Secondary | ICD-10-CM

## 2023-04-07 DIAGNOSIS — O09522 Supervision of elderly multigravida, second trimester: Secondary | ICD-10-CM

## 2023-04-07 DIAGNOSIS — O99212 Obesity complicating pregnancy, second trimester: Secondary | ICD-10-CM

## 2023-04-08 LAB — CBC
Hematocrit: 36.7 % (ref 34.0–46.6)
Hemoglobin: 12.5 g/dL (ref 11.1–15.9)
MCH: 31.2 pg (ref 26.6–33.0)
MCHC: 34.1 g/dL (ref 31.5–35.7)
MCV: 92 fL (ref 79–97)
Platelets: 258 10*3/uL (ref 150–450)
RBC: 4.01 x10E6/uL (ref 3.77–5.28)
RDW: 12.6 % (ref 11.7–15.4)
WBC: 9.3 10*3/uL (ref 3.4–10.8)

## 2023-04-08 LAB — HIV ANTIBODY (ROUTINE TESTING W REFLEX): HIV Screen 4th Generation wRfx: NONREACTIVE

## 2023-04-08 LAB — GLUCOSE TOLERANCE, 2 HOURS W/ 1HR
Glucose, 1 hour: 140 mg/dL (ref 70–179)
Glucose, 2 hour: 124 mg/dL (ref 70–152)
Glucose, Fasting: 84 mg/dL (ref 70–91)

## 2023-04-08 LAB — RPR: RPR Ser Ql: NONREACTIVE

## 2023-04-11 ENCOUNTER — Other Ambulatory Visit: Payer: No Typology Code available for payment source

## 2023-04-13 ENCOUNTER — Other Ambulatory Visit: Payer: Self-pay | Admitting: *Deleted

## 2023-04-13 DIAGNOSIS — O99212 Obesity complicating pregnancy, second trimester: Secondary | ICD-10-CM

## 2023-04-13 DIAGNOSIS — O09522 Supervision of elderly multigravida, second trimester: Secondary | ICD-10-CM

## 2023-05-05 ENCOUNTER — Other Ambulatory Visit: Payer: Self-pay

## 2023-05-05 ENCOUNTER — Ambulatory Visit: Payer: No Typology Code available for payment source | Admitting: Obstetrics and Gynecology

## 2023-05-05 VITALS — BP 108/71 | HR 102 | Wt 206.4 lb

## 2023-05-05 DIAGNOSIS — O0990 Supervision of high risk pregnancy, unspecified, unspecified trimester: Secondary | ICD-10-CM

## 2023-05-05 DIAGNOSIS — Z3A30 30 weeks gestation of pregnancy: Secondary | ICD-10-CM | POA: Diagnosis not present

## 2023-05-05 DIAGNOSIS — B009 Herpesviral infection, unspecified: Secondary | ICD-10-CM

## 2023-05-05 DIAGNOSIS — Z23 Encounter for immunization: Secondary | ICD-10-CM | POA: Diagnosis not present

## 2023-05-05 DIAGNOSIS — O0993 Supervision of high risk pregnancy, unspecified, third trimester: Secondary | ICD-10-CM

## 2023-05-05 DIAGNOSIS — O09523 Supervision of elderly multigravida, third trimester: Secondary | ICD-10-CM

## 2023-05-05 NOTE — Progress Notes (Signed)
   PRENATAL VISIT NOTE  Subjective:  Kelly Lopez is a 42 y.o. G6P0140 at [redacted]w[redacted]d being seen today for ongoing prenatal care.  She is currently monitored for the following issues for this high-risk pregnancy and has Obesity; History of fetal loss; Supervision of high risk pregnancy, antepartum; AMA (advanced maternal age) multigravida 35+; HSV-1 (herpes simplex virus 1) infection; Susceptible to varicella (non-immune), currently pregnant; Major depressive disorder, recurrent, in partial remission (HCC); Alpha-1-antitrypsin deficiency carrier; and [redacted] weeks gestation of pregnancy on their problem list.  Patient doing well with no acute concerns today. She reports no complaints.  Contractions: Not present. Vag. Bleeding: None.  Movement: Present. Denies leaking of fluid.   The following portions of the patient's history were reviewed and updated as appropriate: allergies, current medications, past family history, past medical history, past social history, past surgical history and problem list. Problem list updated.  Objective:   Vitals:   05/05/23 0833  BP: 108/71  Pulse: (!) 102  Weight: 206 lb 6.4 oz (93.6 kg)    Fetal Status: Fetal Heart Rate (bpm): 140 Fundal Height: 30 cm Movement: Present     General:  Alert, oriented and cooperative. Patient is in no acute distress.  Skin: Skin is warm and dry. No rash noted.   Cardiovascular: Normal heart rate noted  Respiratory: Normal respiratory effort, no problems with respiration noted  Abdomen: Soft, gravid, appropriate for gestational age.  Pain/Pressure: Absent     Pelvic: Cervical exam deferred        Extremities: Normal range of motion.  Edema: None  Mental Status:  Normal mood and affect. Normal behavior. Normal judgment and thought content.   Assessment and Plan:  Pregnancy: G6P0140 at [redacted]w[redacted]d  1. Supervision of high risk pregnancy, antepartum Continue routine prenatal care - Tdap vaccine greater than or equal to 7yo IM  2. [redacted]  weeks gestation of pregnancy   3. Multigravida of advanced maternal age in third trimester Pt has serial growth scans scheduled  4. HSV-1 (herpes simplex virus 1) infection Pt is on suppressive valtrex with no recent out break  Preterm labor symptoms and general obstetric precautions including but not limited to vaginal bleeding, contractions, leaking of fluid and fetal movement were reviewed in detail with the patient.  Please refer to After Visit Summary for other counseling recommendations.   Return in about 2 weeks (around 05/19/2023) for [facility], in person.   Mariel Aloe, MD Faculty Attending Center for [facility]

## 2023-05-18 ENCOUNTER — Ambulatory Visit: Payer: No Typology Code available for payment source | Attending: Obstetrics and Gynecology

## 2023-05-18 DIAGNOSIS — Z148 Genetic carrier of other disease: Secondary | ICD-10-CM | POA: Diagnosis not present

## 2023-05-18 DIAGNOSIS — O285 Abnormal chromosomal and genetic finding on antenatal screening of mother: Secondary | ICD-10-CM | POA: Diagnosis not present

## 2023-05-18 DIAGNOSIS — B009 Herpesviral infection, unspecified: Secondary | ICD-10-CM | POA: Diagnosis not present

## 2023-05-18 DIAGNOSIS — O09293 Supervision of pregnancy with other poor reproductive or obstetric history, third trimester: Secondary | ICD-10-CM

## 2023-05-18 DIAGNOSIS — O09523 Supervision of elderly multigravida, third trimester: Secondary | ICD-10-CM

## 2023-05-18 DIAGNOSIS — E669 Obesity, unspecified: Secondary | ICD-10-CM

## 2023-05-18 DIAGNOSIS — Z3A32 32 weeks gestation of pregnancy: Secondary | ICD-10-CM

## 2023-05-18 DIAGNOSIS — O99213 Obesity complicating pregnancy, third trimester: Secondary | ICD-10-CM

## 2023-05-18 DIAGNOSIS — O98513 Other viral diseases complicating pregnancy, third trimester: Secondary | ICD-10-CM

## 2023-05-18 DIAGNOSIS — O09522 Supervision of elderly multigravida, second trimester: Secondary | ICD-10-CM | POA: Insufficient documentation

## 2023-05-18 DIAGNOSIS — O99212 Obesity complicating pregnancy, second trimester: Secondary | ICD-10-CM | POA: Diagnosis present

## 2023-05-19 ENCOUNTER — Encounter: Payer: Self-pay | Admitting: Family Medicine

## 2023-05-26 ENCOUNTER — Ambulatory Visit (INDEPENDENT_AMBULATORY_CARE_PROVIDER_SITE_OTHER): Payer: No Typology Code available for payment source | Admitting: Family Medicine

## 2023-05-26 ENCOUNTER — Other Ambulatory Visit: Payer: Self-pay

## 2023-05-26 VITALS — BP 106/72 | HR 97 | Wt 210.9 lb

## 2023-05-26 DIAGNOSIS — O09523 Supervision of elderly multigravida, third trimester: Secondary | ICD-10-CM

## 2023-05-26 DIAGNOSIS — Z3A33 33 weeks gestation of pregnancy: Secondary | ICD-10-CM

## 2023-05-26 DIAGNOSIS — O0990 Supervision of high risk pregnancy, unspecified, unspecified trimester: Secondary | ICD-10-CM

## 2023-05-26 DIAGNOSIS — F3341 Major depressive disorder, recurrent, in partial remission: Secondary | ICD-10-CM

## 2023-05-26 NOTE — Patient Instructions (Signed)
[facility] Pediatric Providers  [place] ([id number]) [facility] Manson Passey, MD; Deirdre Priest, MD; Lum Babe, MD; Leveda Anna, MD; McDiarmid, MD; Jerene Bears, MD [address] [place] [place], [place] [address] [phone] Mon-Fri 8:30-12:30, 1:30-5:00  Providers come to see babies during newborn hospitalization Only accepting infants of Mother's who are seen at [facility] or have siblings seen at   [facility] Medicaid - Yes; Tricare - Yes   [facility] Morton, MD [address] [place], [place] [address] [phone] Mon, Tue, Thur, Fri 8:30-5:00, Wed 10:00-7:00 (closed 1-2pm daily for lunch) [facility] residents with no insurance.  [place] [employer] only with Medicaid/insurance; Tricare - no  [facility]) - Tim and [facility], MD; Manson Passey, MD; Ave Filter, MD; Luna Fuse, MD; Kennedy Bucker, MD; Florestine Avers, MD; Melchor Amour, MD; Yetta Barre,  MD; Konrad Dolores, MD; Kathlene November, MD; Jenne Campus, MD; Wynetta Emery, MD; Duffy Rhody, MD; Gerre Couch, NP [address] [place], [place], [place] [address] [phone] Mon, Tue, Thur, Fri 8:30-5:30, Wed 9:30-5:30, Sat 8:30-12:30 Only accepting infants of first-time parents or siblings of current patients Hospital discharge coordinator will make follow-up appointment Medicaid - yes; Tricare - yes  [place] [phone]) [facility] Ilean China, MD; Earlene Plater, MD; Jamesetta Orleans, MD; Alvera Novel, MD; Rana Snare, MD; [facility], MD; Shaaron Adler, MD; Hosie Poisson, MD; Mayford Knife, MD [address], [place], [place] [address] [phone] Mon-Fri 8:30-5:00, closed for lunch 12:30-1:30; Sat-Sun 10:00-1:00 Accepting Newborns with commercial insurance only, must call prior to delivery to be accepted into  practice.  Medicaid - no, Tricare - yes   [facility] [address] [place], [place] [address] [phone] or [phone] Mon to Fri 8am to 10pm, Sat 8am to 1pm  (virtual only on weekends) Only accepts Medicaid Healthy Blue pts  [facility] - Pediatrics at Elige Radon, MD; Sabino Dick, MD; Quitman Livings, MD; Betha Loa, NP; Claretha Cooper, MD; Lelon Perla, MD [address] [place], [place] [address] [phone] Mon-Fri 8:30-5:30 Medicaid - yes, Tricare - yes  [place] [phone]) [facility] Marcie Mowers, MD [address], [place], [place] [address] [phone] Iona Hansen, Wed Fri 8:30-5:00, Sat 8:30-12:00, Closed Thursdays Accepting siblings of established patients and first time mom's if you call prenatally Medicaid- yes; Tricare - yes  [facility] Lutricia Feil, [place]; Tracie Harrier, MD; Rusty Aus; [place], PA; Wynelle Link, MD; Azucena Cecil, MD;  [address] [place], [place] [address] [phone] Mon-Fri 8:30-5:00, closed for lunch 1-2 Only accepting newborns of established patients Medicaid- no; Tricare - yes  [facility] [phone]) [facility] Morene Crocker, MD; [address], [place], [place] [address] [phone] Mon-Fri 8:00-5:00 Medicaid - No; Tricare - Yes  [facility], [place]; [place], [place] [address], [place], [place] [address] [phone] Mon-Fri 8:00-5:00 Medicaid - No, Tricare - Yes  [facility] Cardell Peach, MD; Nash Dimmer, MD; [place], [place] [address] [place], [place] [address] [phone]  Mon-Fri 8:00-5:00 Medicaid - No; Tricare - Yes  [facility] [address] [place], [place] [address] [phone] Mon-Fri 8:30-5:00 (lunch 12:00-1:00) Medicaid -Yes; Tricare - Yes  [facility] [place], MD [address] [place], [place], [place] [address] [phone] Mon-Fri 8:00-5:00 Seeing newborns of current patients only. No new patients Medicaid - No, Tricare - yes  Nature conservation officer at [place] [address], MD [address] [place], [place] [address] [phone] Mon-Fri 8:00-5:00 Medicaid -yes as secondary coverage only;  Tricare - yes  [facility] [place], [place]; [place], [place]; Avis Epley, MD; Vonna Kotyk, MD; Clance Boll, MD; [place], [place]; [place], NP; Vaughan Basta, MD; [place], MD [address] [place], [place] [address] [phone] Mon-Fri 8:30-5:00, Sat 9:00-11:00 Accepts commercial insurance ONLY. Offers free prenatal information sessions for families. Medicaid - No, Tricare - Call first  [facility] [place], MD; [place], [place]; [place], [place]; [place], [place] [address] [place] [address] [phone] Mon-Fri 7:30-5:30 Medicaid - Yes; Ailene Rud yes  [place] [phone] & [phone])  [facility], MD [address], [place], [place] [address] [phone] Mon-Thur 8:00-6:00, closed for lunch 12-2, closed Fridays Medicaid - yes; Tricare - no  [facility] Dareen Piano, NP; Cyndia Bent, MD; [place], [place]; [place], [place] [address], [place], [place] [address] [phone] Mon-Fri 7:30-4:30 Medicaid - yes, Tricare - yes  [facility]  Juanito Doom, MD; Janene Harvey, NP; Vonita Moss, MD; Donn Pierini, NP [address], [place], [place] [address] [phone] Mon-Fri 8:30-5:00, closed for lunch 1-2, Sat 8:30-12:00 - sick visits only Providers come to see babies at [facility] Only accepting newborns of siblings and first time parents ONLY if who have met with office prior to delivery Medicaid -Yes; Tricare - yes  [facility] [place], [place]; Spero Geralds, NP; Earlene Plater, MD; Lucretia Roers, MD:  [address], [place], [place] [address] [phone] Mon- Fri 8:00-5:00, Sat 9:00-12:00 - sick visits only Accepting siblings of established patients and first time mom/baby Medicaid - Yes; Tricare - yes Patients must have vaccinations (baby vaccines)  [place] [phone] &  [phone])  Adult nurse HealthCare at [facility] [address] [place], [place] [address] [phone] Mon-Fri 8:00-5:00 Medicaid - no; Tricare - yes  [facility] [place], MD; [place], [place]; [place], [place] [address], [place], [place] [address] [phone] Mon-Fri 8:00-5:00 Medicaid- yes; Tricare - yes  [facility] - Ardeen Jourdain, MD; Yetta Barre, NP; [place], [place] [address] [place], [place], [place] [address] [phone] Mon-Fri 8:00-5:00 Medicaid - Yes; Tricare - yes  [address] [place] [phone])  [facility] [place], [place]; [place], [place]; Eddie Candle, MD; Normand Sloop, MD; [place], NP; [place], DO; [place], [place]; Constance Goltz, MD; Ruthann Cancer, MD; Vear Clock, MD; [place], [place]; [place], [place]; [place], [place] [address] [facility] [address], [place], [place] [address] [phone] Mon-Fri 8:30-5:00, Sat 9:00-12:00 - sick only Please register online [email or web address] then schedule online or call office Medicaid-Yes; Tricare -yes  [facility] - [place]  [place], MD; Romualdo Bolk, MD; [place], MD; [place], NP; [place], [place]; Antonietta Barcelona, MD; Mayford Knife, NP; Shelva Majestic, MD [address], [place], [place] [address] [phone] Mon-Fri 8:00-5:30, Sat&Sun by appointment (phones open at 8:30) Medicaid - Yes; Tricare - yes  [place] [phone] & [phone]) [facility] Mariel Aloe; [place], MD; Roger Shelter, MD; Arvilla Market, NP; [place], DO [address], [place], [place] [address] [phone] M-F 8:00 - 5:15, Sat/Sun 9-12 sick visits only Medicaid - No; Tricare - yes  [facility]  [place], PA-C; [place], PA-C; [place], DO; [place], PA-C; [place], PA-C; Roselyn Bering, MD [address], [place], [place] [address] [phone] Mon-Thur 8:00-7:00, Fri 8:00-5:00 Accepting Medicaid for 13 and under only   [facility]  at [place] [facility] [place] [place], [place]; List, FNP; Berneda Rose, MD; [place], PA-C; Scholer,  MD; Kellie Simmering, FNP; Genevie Cheshire, FNP; Evaristo Bury, MD; Berneda Rose, MD [phone] [address] [place], [place] [address] [phone] Mon-Fri 8:30-5:30 Medicaid - Yes; Tricare - yes  [facility] - [address]  [place], [place]; Whitney Post, MD; Hennie Duos, MD; Wynne Dust, MD; [place], NP [address], [place], [place] [address] [phone] Mon-Thur 8:00-5:30, Fri 8:00-5:00, Sat 9:00-12:00 Medicaid - yes, Tricare - yes

## 2023-05-26 NOTE — Progress Notes (Signed)
   PRENATAL VISIT NOTE  Subjective:  Kelly Lopez is a 42 y.o. G6P0140 at [redacted]w[redacted]d being seen today for ongoing prenatal care.  She is currently monitored for the following issues for this high-risk pregnancy and has Obesity; History of fetal loss; Supervision of high risk pregnancy, antepartum; AMA (advanced maternal age) multigravida 35+; HSV-1 (herpes simplex virus 1) infection; Susceptible to varicella (non-immune), currently pregnant; Major depressive disorder, recurrent, in partial remission (HCC); and Alpha-1-antitrypsin deficiency carrier on their problem list.  Patient reports no complaints.  Contractions: Not present. Vag. Bleeding: None.  Movement: Present. Denies leaking of fluid.   The following portions of the patient's history were reviewed and updated as appropriate: allergies, current medications, past family history, past medical history, past social history, past surgical history and problem list.   Objective:   Vitals:   05/26/23 1119  BP: 106/72  Pulse: 97  Weight: 210 lb 14.4 oz (95.7 kg)    Fetal Status: Fetal Heart Rate (bpm): 155 Fundal Height: 33 cm Movement: Present     General:  Alert, oriented and cooperative. Patient is in no acute distress.  Skin: Skin is warm and dry. No rash noted.   Cardiovascular: Normal heart rate noted  Respiratory: Normal respiratory effort, no problems with respiration noted  Abdomen: Soft, gravid, appropriate for gestational age.  Pain/Pressure: Absent     Pelvic: Cervical exam deferred        Extremities: Normal range of motion.  Edema: Trace  Mental Status: Normal mood and affect. Normal behavior. Normal judgment and thought content.   Assessment and Plan:  Pregnancy: G6P0140 at [redacted]w[redacted]d 1. Supervision of high risk pregnancy, antepartum Continue routine prenatal care.  2. Multigravida of advanced maternal age in third trimester Appropriate fetal growth, testing with MFM Book for IOL @ 39 weeks per MFM due to age > 40 at  next visit ON ASA  3. Major depressive disorder, recurrent, in partial remission (HCC) On Effexor  4. Undesired fertility Patient counseled, r.e. Risks benefits of BTL, including permanency of procedure, discussed benefits of salpingectomy including almost no failure and prevention of ovarian cancer.  Patient verbalized understanding and desires to proceed - consent signed today  5. 33 weeks  Preterm labor symptoms and general obstetric precautions including but not limited to vaginal bleeding, contractions, leaking of fluid and fetal movement were reviewed in detail with the patient. Please refer to After Visit Summary for other counseling recommendations.   Return in 2 weeks (on 06/09/2023).  Future Appointments  Date Time Provider Department Center  06/14/2023  8:15 AM Venora Maples, MD [facility]  06/14/2023  9:30 AM [facility] US1 [facility]  06/22/2023  8:45 AM [facility] NST [facility]  06/29/2023  8:45 AM [facility] NST [facility]  07/06/2023  8:45 AM [facility] NST [facility]    Reva Bores, MD

## 2023-06-14 ENCOUNTER — Other Ambulatory Visit: Payer: Self-pay

## 2023-06-14 ENCOUNTER — Encounter: Payer: Self-pay | Admitting: Family Medicine

## 2023-06-14 ENCOUNTER — Ambulatory Visit: Payer: No Typology Code available for payment source

## 2023-06-14 ENCOUNTER — Ambulatory Visit (INDEPENDENT_AMBULATORY_CARE_PROVIDER_SITE_OTHER): Payer: No Typology Code available for payment source | Admitting: Family Medicine

## 2023-06-14 ENCOUNTER — Other Ambulatory Visit (HOSPITAL_COMMUNITY)
Admission: RE | Admit: 2023-06-14 | Discharge: 2023-06-14 | Disposition: A | Discharge status: Home / Self Care | Payer: No Typology Code available for payment source | Source: Ambulatory Visit | Attending: Family Medicine | Admitting: Family Medicine

## 2023-06-14 VITALS — BP 122/79 | HR 94 | Wt 214.5 lb

## 2023-06-14 DIAGNOSIS — O99213 Obesity complicating pregnancy, third trimester: Secondary | ICD-10-CM | POA: Diagnosis not present

## 2023-06-14 DIAGNOSIS — Z23 Encounter for immunization: Secondary | ICD-10-CM | POA: Diagnosis not present

## 2023-06-14 DIAGNOSIS — O285 Abnormal chromosomal and genetic finding on antenatal screening of mother: Secondary | ICD-10-CM

## 2023-06-14 DIAGNOSIS — O0990 Supervision of high risk pregnancy, unspecified, unspecified trimester: Secondary | ICD-10-CM | POA: Diagnosis present

## 2023-06-14 DIAGNOSIS — O09523 Supervision of elderly multigravida, third trimester: Secondary | ICD-10-CM

## 2023-06-14 DIAGNOSIS — O09293 Supervision of pregnancy with other poor reproductive or obstetric history, third trimester: Secondary | ICD-10-CM

## 2023-06-14 DIAGNOSIS — O09522 Supervision of elderly multigravida, second trimester: Secondary | ICD-10-CM

## 2023-06-14 DIAGNOSIS — O99212 Obesity complicating pregnancy, second trimester: Secondary | ICD-10-CM | POA: Insufficient documentation

## 2023-06-14 DIAGNOSIS — Z148 Genetic carrier of other disease: Secondary | ICD-10-CM

## 2023-06-14 DIAGNOSIS — Z3A36 36 weeks gestation of pregnancy: Secondary | ICD-10-CM

## 2023-06-14 DIAGNOSIS — B009 Herpesviral infection, unspecified: Secondary | ICD-10-CM

## 2023-06-14 DIAGNOSIS — F3341 Major depressive disorder, recurrent, in partial remission: Secondary | ICD-10-CM

## 2023-06-14 DIAGNOSIS — E669 Obesity, unspecified: Secondary | ICD-10-CM

## 2023-06-14 DIAGNOSIS — Z8759 Personal history of other complications of pregnancy, childbirth and the puerperium: Secondary | ICD-10-CM

## 2023-06-14 DIAGNOSIS — O0993 Supervision of high risk pregnancy, unspecified, third trimester: Secondary | ICD-10-CM

## 2023-06-14 NOTE — Patient Instructions (Signed)

## 2023-06-14 NOTE — Progress Notes (Signed)
   Subjective:  Kelly Lopez is a 42 y.o. G6P0140 at [redacted]w[redacted]d being seen today for ongoing prenatal care.  She is currently monitored for the following issues for this high-risk pregnancy and has Obesity; History of fetal loss; Supervision of high risk pregnancy, antepartum; AMA (advanced maternal age) multigravida 35+; HSV-1 (herpes simplex virus 1) infection; Susceptible to varicella (non-immune), currently pregnant; Major depressive disorder, recurrent, in partial remission (HCC); and Alpha-1-antitrypsin deficiency carrier on their problem list.  Patient reports no complaints.  Contractions: Not present. Vag. Bleeding: None.  Movement: Present. Denies leaking of fluid.   The following portions of the patient's history were reviewed and updated as appropriate: allergies, current medications, past family history, past medical history, past social history, past surgical history and problem list. Problem list updated.  Objective:   Vitals:   06/14/23 0839  BP: 122/79  Pulse: 94  Weight: 214 lb 8 oz (97.3 kg)    Fetal Status: Fetal Heart Rate (bpm): 149   Movement: Present     General:  Alert, oriented and cooperative. Patient is in no acute distress.  Skin: Skin is warm and dry. No rash noted.   Cardiovascular: Normal heart rate noted  Respiratory: Normal respiratory effort, no problems with respiration noted  Abdomen: Soft, gravid, appropriate for gestational age. Pain/Pressure: Present     Pelvic: Vag. Bleeding: None     Cervical exam deferred        Extremities: Normal range of motion.  Edema: Trace  Mental Status: Normal mood and affect. Normal behavior. Normal judgment and thought content.   Urinalysis:      Assessment and Plan:  Pregnancy: G6P0140 at [redacted]w[redacted]d  1. Supervision of high risk pregnancy, antepartum BP and FHR normal Swabs collected - Culture, beta strep (group b only) - GC/Chlamydia probe amp ([place])not at [facility]  2. Multigravida of advanced maternal age in  third trimester Turning 42 in a few days, on ASA IOL scheduled for 39 weeks, 07/04/23 at midnight Form faxed, orders placed  3. 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 HSV-1 (herpes simplex virus 1) infection Taking valtrex  5. Major depressive disorder, recurrent, in partial remission (HCC) On effexor   Preterm labor symptoms and general obstetric precautions including but not limited to vaginal bleeding, contractions, leaking of fluid and fetal movement were reviewed in detail with the patient. Please refer to After Visit Summary for other counseling recommendations.  Return in 1 week (on 06/21/2023) for [facility], ob visit.   Venora Maples, MD

## 2023-06-15 LAB — GC/CHLAMYDIA PROBE AMP (~~LOC~~) NOT AT ARMC
Chlamydia: NEGATIVE
Comment: NEGATIVE
Comment: NORMAL
Neisseria Gonorrhea: NEGATIVE

## 2023-06-17 ENCOUNTER — Encounter: Payer: Self-pay | Admitting: Family Medicine

## 2023-06-17 DIAGNOSIS — O99820 Streptococcus B carrier state complicating pregnancy: Secondary | ICD-10-CM | POA: Insufficient documentation

## 2023-06-17 LAB — CULTURE, BETA STREP (GROUP B ONLY): Strep Gp B Culture: POSITIVE — AB

## 2023-06-21 ENCOUNTER — Encounter: Payer: No Typology Code available for payment source | Admitting: Obstetrics and Gynecology

## 2023-06-22 ENCOUNTER — Ambulatory Visit (INDEPENDENT_AMBULATORY_CARE_PROVIDER_SITE_OTHER): Payer: No Typology Code available for payment source | Admitting: Obstetrics and Gynecology

## 2023-06-22 ENCOUNTER — Ambulatory Visit: Payer: No Typology Code available for payment source | Attending: Maternal & Fetal Medicine | Admitting: *Deleted

## 2023-06-22 VITALS — BP 119/80 | HR 104 | Wt 213.6 lb

## 2023-06-22 DIAGNOSIS — O0990 Supervision of high risk pregnancy, unspecified, unspecified trimester: Secondary | ICD-10-CM

## 2023-06-22 DIAGNOSIS — Z3A37 37 weeks gestation of pregnancy: Secondary | ICD-10-CM | POA: Insufficient documentation

## 2023-06-22 DIAGNOSIS — O09523 Supervision of elderly multigravida, third trimester: Secondary | ICD-10-CM | POA: Insufficient documentation

## 2023-06-22 DIAGNOSIS — Z8759 Personal history of other complications of pregnancy, childbirth and the puerperium: Secondary | ICD-10-CM | POA: Insufficient documentation

## 2023-06-22 DIAGNOSIS — B009 Herpesviral infection, unspecified: Secondary | ICD-10-CM

## 2023-06-22 DIAGNOSIS — O0993 Supervision of high risk pregnancy, unspecified, third trimester: Secondary | ICD-10-CM

## 2023-06-22 DIAGNOSIS — O99820 Streptococcus B carrier state complicating pregnancy: Secondary | ICD-10-CM

## 2023-06-22 DIAGNOSIS — Z148 Genetic carrier of other disease: Secondary | ICD-10-CM

## 2023-06-22 DIAGNOSIS — O99213 Obesity complicating pregnancy, third trimester: Secondary | ICD-10-CM | POA: Insufficient documentation

## 2023-06-22 DIAGNOSIS — E669 Obesity, unspecified: Secondary | ICD-10-CM

## 2023-06-22 NOTE — Procedures (Signed)
Kelly Lopez April 20, 1981 [redacted]w[redacted]d  Fetus A Non-Stress Test Interpretation for 06/22/23-NST only  Indication: Advanced Maternal Age >40 years and obese  Fetal Heart Rate A Mode: External Baseline Rate (A): 135 bpm Variability: Moderate Accelerations: 15 x 15 Decelerations: None Multiple birth?: No  Uterine Activity Mode: Toco Contraction Frequency (min): none Resting Tone Palpated: Relaxed  Interpretation (Fetal Testing) Nonstress Test Interpretation: Reactive Comments: Tracing reviewed by Dr. Darra Lis

## 2023-06-22 NOTE — Progress Notes (Signed)
   PRENATAL VISIT NOTE  Subjective:  Kelly Lopez is a 42 y.o. G6P0140 at [redacted]w[redacted]d being seen today for ongoing prenatal care.  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  Patient doing well with no acute concerns today. She reports no complaints.  Contractions: Not present. Vag. Bleeding: None.  Movement: Present. Denies leaking of fluid.   The following portions of the patient's history were reviewed and updated as appropriate: allergies, current medications, past family history, past medical history, past social history, past surgical history and problem list. Problem list updated.  Objective:   Vitals:   06/22/23 1002  BP: 119/80  Pulse: (!) 104  Weight: 213 lb 9.6 oz (96.9 kg)    Fetal Status: Fetal Heart Rate (bpm): 147 Fundal Height: 38 cm Movement: Present     General:  Alert, oriented and cooperative. Patient is in no acute distress.  Skin: Skin is warm and dry. No rash noted.   Cardiovascular: Normal heart rate noted  Respiratory: Normal respiratory effort, no problems with respiration noted  Abdomen: Soft, gravid, appropriate for gestational age.  Pain/Pressure: Absent     Pelvic: Cervical exam deferred        Extremities: Normal range of motion.  Edema: Trace  Mental Status:  Normal mood and affect. Normal behavior. Normal judgment and thought content.   Assessment and Plan:  Pregnancy: G6P0140 at [redacted]w[redacted]d  1. [redacted] weeks gestation of pregnancy   2. Multigravida of advanced maternal age in third  trimester   3. Alpha-1-antitrypsin deficiency carrier   4. GBS (group B Streptococcus carrier), +RV culture, currently pregnant Prophylaxis in labor  5. History of fetal loss Continue weekly NST until delivery, IOL previoulsy scheduled for 07/04/23  6. HSV-1 (herpes simplex virus 1) infection Continue viral prophylaxis  7. Supervision of high risk pregnancy, antepartum Continue routine prenatal care  Term labor symptoms and general obstetric precautions including but not limited to vaginal bleeding, contractions, leaking of fluid and fetal movement were reviewed in detail with the patient.  Please refer to After Visit Summary for other counseling recommendations.   Return in about 1 week (around 06/29/2023) for [facility], in person.   Mariel Aloe, MD Faculty Attending Center for [facility]

## 2023-06-28 ENCOUNTER — Encounter (HOSPITAL_COMMUNITY): Payer: Self-pay | Admitting: *Deleted

## 2023-06-28 ENCOUNTER — Telehealth (HOSPITAL_COMMUNITY): Payer: Self-pay | Admitting: *Deleted

## 2023-06-28 NOTE — Telephone Encounter (Signed)
Preadmission screen  

## 2023-06-29 ENCOUNTER — Other Ambulatory Visit: Payer: Self-pay | Admitting: Advanced Practice Midwife

## 2023-06-29 ENCOUNTER — Ambulatory Visit: Payer: No Typology Code available for payment source | Attending: Obstetrics and Gynecology | Admitting: *Deleted

## 2023-06-29 ENCOUNTER — Ambulatory Visit (INDEPENDENT_AMBULATORY_CARE_PROVIDER_SITE_OTHER): Payer: No Typology Code available for payment source | Admitting: Family Medicine

## 2023-06-29 VITALS — BP 110/76 | HR 96 | Wt 216.7 lb

## 2023-06-29 DIAGNOSIS — O09523 Supervision of elderly multigravida, third trimester: Secondary | ICD-10-CM | POA: Insufficient documentation

## 2023-06-29 DIAGNOSIS — Z3A38 38 weeks gestation of pregnancy: Secondary | ICD-10-CM | POA: Insufficient documentation

## 2023-06-29 DIAGNOSIS — Z1332 Encounter for screening for maternal depression: Secondary | ICD-10-CM

## 2023-06-29 DIAGNOSIS — Z8759 Personal history of other complications of pregnancy, childbirth and the puerperium: Secondary | ICD-10-CM

## 2023-06-29 DIAGNOSIS — F3341 Major depressive disorder, recurrent, in partial remission: Secondary | ICD-10-CM

## 2023-06-29 DIAGNOSIS — O0990 Supervision of high risk pregnancy, unspecified, unspecified trimester: Secondary | ICD-10-CM

## 2023-06-29 DIAGNOSIS — O09293 Supervision of pregnancy with other poor reproductive or obstetric history, third trimester: Secondary | ICD-10-CM | POA: Diagnosis not present

## 2023-06-29 DIAGNOSIS — Z148 Genetic carrier of other disease: Secondary | ICD-10-CM

## 2023-06-29 DIAGNOSIS — O0993 Supervision of high risk pregnancy, unspecified, third trimester: Secondary | ICD-10-CM

## 2023-06-29 DIAGNOSIS — B009 Herpesviral infection, unspecified: Secondary | ICD-10-CM

## 2023-06-29 DIAGNOSIS — O99820 Streptococcus B carrier state complicating pregnancy: Secondary | ICD-10-CM

## 2023-06-29 NOTE — Procedures (Signed)
Kelly Lopez [birth year] [redacted]w[redacted]d  Fetus A Non-Stress Test Interpretation for 06/29/23-NST only  Indication: Advanced Maternal Age >40 years and , Prev IUFD, Obese  Fetal Heart Rate A Mode: External Baseline Rate (A): 130 bpm Variability: Moderate Accelerations: 15 x 15 Decelerations: None Multiple birth?: No  Uterine Activity Mode: Toco Contraction Frequency (min): irreg Contraction Duration (sec): 70-120 Contraction Quality: Mild Resting Tone Palpated: Relaxed  Interpretation (Fetal Testing) Nonstress Test Interpretation: Reactive Comments: Tracing reviewed byDr. Judeth Cornfield

## 2023-06-29 NOTE — Progress Notes (Signed)
   PRENATAL VISIT NOTE  Subjective:  Kelly Lopez is a 42 y.o. G6P0140 at [redacted]w[redacted]d being seen today for ongoing prenatal care.  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  Patient reports no complaints.  Contractions: Not present. Vag. Bleeding: None.  Movement: Present. Denies leaking of fluid.   The following portions of the patient's history were reviewed and updated as appropriate: allergies, current medications, past family history, past medical history, past social history, past surgical history and problem list.   Objective:   Vitals:   06/29/23 1030  BP: 110/76  Pulse: 96  Weight: 216 lb 11.2 oz (98.3 kg)    Fetal Status: Fetal Heart Rate (bpm): 138   Movement: Present     General:  Alert, oriented and cooperative. Patient is in no acute distress.  Skin: Skin is warm and dry. No rash noted.   Cardiovascular: Normal heart rate noted  Respiratory: Normal respiratory effort, no problems with respiration noted  Abdomen: Soft, gravid, appropriate for gestational age.  Pain/Pressure: Present     Pelvic: Cervical exam deferred        Extremities: Normal range of motion.  Edema: Trace  Mental Status: Normal mood and affect. Normal behavior. Normal judgment and thought content.   Assessment and Plan:  Pregnancy: G6P0140 at [redacted]w[redacted]d 1. Supervision of high risk pregnancy, antepartum Scheduled for induction 10/14  2. Multigravida of advanced maternal age in third trimester  3. Alpha-1-antitrypsin deficiency  carrier  4. GBS (group B Streptococcus carrier), +RV culture, currently pregnant Will need prophylaxis during labor  5. History of fetal loss Continuing weekly NSTs until delivery.  6. HSV-1 (herpes simplex virus 1) infection Currently on suppression, continue suppression until delivery  7. Major depressive disorder, recurrent, in partial remission (HCC) Doing well  8. [redacted] weeks gestation of pregnancy  Term labor symptoms and general obstetric precautions including but not limited to vaginal bleeding, contractions, leaking of fluid and fetal movement were reviewed in detail with the patient. Please refer to After Visit Summary for other counseling recommendations.   No follow-ups on file.  Future Appointments  Date Time Provider Department Center  07/04/2023 12:00 AM [facility] SCHED ROOM [facility] None    Celedonio Savage, MD

## 2023-07-04 ENCOUNTER — Inpatient Hospital Stay (HOSPITAL_COMMUNITY): Payer: No Typology Code available for payment source | Admitting: Anesthesiology

## 2023-07-04 ENCOUNTER — Other Ambulatory Visit: Payer: Self-pay

## 2023-07-04 ENCOUNTER — Encounter (HOSPITAL_COMMUNITY): Payer: Self-pay | Admitting: Family Medicine

## 2023-07-04 ENCOUNTER — Inpatient Hospital Stay (HOSPITAL_COMMUNITY): Payer: No Typology Code available for payment source

## 2023-07-04 ENCOUNTER — Inpatient Hospital Stay (HOSPITAL_COMMUNITY)
Admission: RE | Admit: 2023-07-04 | Discharge: 2023-07-09 | DRG: 783 | Disposition: A | Discharge status: Home / Self Care | Payer: No Typology Code available for payment source | Attending: Obstetrics and Gynecology | Admitting: Obstetrics and Gynecology

## 2023-07-04 DIAGNOSIS — Z3A39 39 weeks gestation of pregnancy: Secondary | ICD-10-CM | POA: Diagnosis not present

## 2023-07-04 DIAGNOSIS — O99820 Streptococcus B carrier state complicating pregnancy: Secondary | ICD-10-CM

## 2023-07-04 DIAGNOSIS — N179 Acute kidney failure, unspecified: Secondary | ICD-10-CM | POA: Diagnosis not present

## 2023-07-04 DIAGNOSIS — Z3A00 Weeks of gestation of pregnancy not specified: Secondary | ICD-10-CM | POA: Diagnosis not present

## 2023-07-04 DIAGNOSIS — B009 Herpesviral infection, unspecified: Secondary | ICD-10-CM | POA: Diagnosis present

## 2023-07-04 DIAGNOSIS — M549 Dorsalgia, unspecified: Secondary | ICD-10-CM | POA: Diagnosis present

## 2023-07-04 DIAGNOSIS — D62 Acute posthemorrhagic anemia: Secondary | ICD-10-CM | POA: Diagnosis not present

## 2023-07-04 DIAGNOSIS — O9962 Diseases of the digestive system complicating childbirth: Secondary | ICD-10-CM | POA: Diagnosis present

## 2023-07-04 DIAGNOSIS — O34211 Maternal care for low transverse scar from previous cesarean delivery: Secondary | ICD-10-CM | POA: Diagnosis present

## 2023-07-04 DIAGNOSIS — O0990 Supervision of high risk pregnancy, unspecified, unspecified trimester: Secondary | ICD-10-CM

## 2023-07-04 DIAGNOSIS — O9081 Anemia of the puerperium: Secondary | ICD-10-CM | POA: Diagnosis not present

## 2023-07-04 DIAGNOSIS — O9912 Other diseases of the blood and blood-forming organs and certain disorders involving the immune mechanism complicating childbirth: Secondary | ICD-10-CM | POA: Diagnosis present

## 2023-07-04 DIAGNOSIS — F3341 Major depressive disorder, recurrent, in partial remission: Secondary | ICD-10-CM | POA: Diagnosis present

## 2023-07-04 DIAGNOSIS — O9049 Other postpartum acute kidney failure: Secondary | ICD-10-CM | POA: Diagnosis not present

## 2023-07-04 DIAGNOSIS — Z98891 History of uterine scar from previous surgery: Secondary | ICD-10-CM

## 2023-07-04 DIAGNOSIS — O99214 Obesity complicating childbirth: Secondary | ICD-10-CM | POA: Diagnosis not present

## 2023-07-04 DIAGNOSIS — O99344 Other mental disorders complicating childbirth: Secondary | ICD-10-CM | POA: Diagnosis present

## 2023-07-04 DIAGNOSIS — A6000 Herpesviral infection of urogenital system, unspecified: Secondary | ICD-10-CM | POA: Diagnosis present

## 2023-07-04 DIAGNOSIS — F419 Anxiety disorder, unspecified: Secondary | ICD-10-CM | POA: Diagnosis present

## 2023-07-04 DIAGNOSIS — D65 Disseminated intravascular coagulation [defibrination syndrome]: Secondary | ICD-10-CM | POA: Diagnosis present

## 2023-07-04 DIAGNOSIS — Z9079 Acquired absence of other genital organ(s): Secondary | ICD-10-CM

## 2023-07-04 DIAGNOSIS — K219 Gastro-esophageal reflux disease without esophagitis: Secondary | ICD-10-CM | POA: Diagnosis present

## 2023-07-04 DIAGNOSIS — O99824 Streptococcus B carrier state complicating childbirth: Secondary | ICD-10-CM | POA: Diagnosis present

## 2023-07-04 DIAGNOSIS — Z302 Encounter for sterilization: Secondary | ICD-10-CM | POA: Diagnosis not present

## 2023-07-04 DIAGNOSIS — O26893 Other specified pregnancy related conditions, third trimester: Principal | ICD-10-CM | POA: Diagnosis present

## 2023-07-04 DIAGNOSIS — Z148 Genetic carrier of other disease: Secondary | ICD-10-CM | POA: Diagnosis not present

## 2023-07-04 DIAGNOSIS — O09523 Supervision of elderly multigravida, third trimester: Principal | ICD-10-CM | POA: Diagnosis present

## 2023-07-04 DIAGNOSIS — Z79899 Other long term (current) drug therapy: Secondary | ICD-10-CM

## 2023-07-04 DIAGNOSIS — O9832 Other infections with a predominantly sexual mode of transmission complicating childbirth: Secondary | ICD-10-CM | POA: Diagnosis present

## 2023-07-04 DIAGNOSIS — Z8249 Family history of ischemic heart disease and other diseases of the circulatory system: Secondary | ICD-10-CM

## 2023-07-04 DIAGNOSIS — Z888 Allergy status to other drugs, medicaments and biological substances status: Secondary | ICD-10-CM

## 2023-07-04 DIAGNOSIS — Z87891 Personal history of nicotine dependence: Secondary | ICD-10-CM

## 2023-07-04 DIAGNOSIS — Z7982 Long term (current) use of aspirin: Secondary | ICD-10-CM

## 2023-07-04 LAB — CBC
HCT: 35.9 % — ABNORMAL LOW (ref 36.0–46.0)
Hemoglobin: 12.5 g/dL (ref 12.0–15.0)
MCH: 30.4 pg (ref 26.0–34.0)
MCHC: 34.8 g/dL (ref 30.0–36.0)
MCV: 87.3 fL (ref 80.0–100.0)
Platelets: 250 10*3/uL (ref 150–400)
RBC: 4.11 MIL/uL (ref 3.87–5.11)
RDW: 12.3 % (ref 11.5–15.5)
WBC: 12 10*3/uL — ABNORMAL HIGH (ref 4.0–10.5)
nRBC: 0 % (ref 0.0–0.2)

## 2023-07-04 LAB — RPR: RPR Ser Ql: NONREACTIVE

## 2023-07-04 MED ORDER — TERBUTALINE SULFATE 1 MG/ML IJ SOLN
0.2500 mg | Freq: Once | INTRAMUSCULAR | Status: DC | PRN
  Filled 2023-07-04: qty 1

## 2023-07-04 MED ORDER — EPHEDRINE 5 MG/ML INJ: 10.0000 mg | INTRAVENOUS | Status: DC | PRN

## 2023-07-04 MED ORDER — ACETAMINOPHEN 325 MG PO TABS: 650.0000 mg | ORAL_TABLET | ORAL | Status: DC | PRN

## 2023-07-04 MED ORDER — VALACYCLOVIR HCL 500 MG PO TABS
500.0000 mg | ORAL_TABLET | Freq: Two times a day (BID) | ORAL | Status: DC
  Administered 2023-07-04 – 2023-07-05 (×2): 500 mg via ORAL
  Filled 2023-07-04 (×3): qty 1

## 2023-07-04 MED ORDER — VALACYCLOVIR HCL 500 MG PO TABS
500.0000 mg | ORAL_TABLET | Freq: Two times a day (BID) | ORAL | Status: DC
  Filled 2023-07-04: qty 1

## 2023-07-04 MED ORDER — SODIUM CHLORIDE 0.9 % IV SOLN
12.5000 mg | Freq: Once | INTRAVENOUS | Status: AC
  Administered 2023-07-04: 12.5 mg via INTRAVENOUS
  Filled 2023-07-04: qty 12.5

## 2023-07-04 MED ORDER — LACTATED RINGERS IV SOLN
500.0000 mL | Freq: Once | INTRAVENOUS | Status: AC
  Administered 2023-07-04: 500 mL via INTRAVENOUS

## 2023-07-04 MED ORDER — VENLAFAXINE HCL ER 75 MG PO CP24
75.0000 mg | ORAL_CAPSULE | Freq: Every day | ORAL | Status: DC
  Administered 2023-07-04: 75 mg via ORAL
  Filled 2023-07-04: qty 1

## 2023-07-04 MED ORDER — VALACYCLOVIR HCL 500 MG PO TABS: 500.0000 mg | ORAL_TABLET | Freq: Every day | ORAL | Status: DC

## 2023-07-04 MED ORDER — PHENYLEPHRINE 80 MCG/ML (10ML) SYRINGE FOR IV PUSH (FOR BLOOD PRESSURE SUPPORT)
80.0000 ug | PREFILLED_SYRINGE | INTRAVENOUS | Status: DC | PRN
  Administered 2023-07-05: 80 ug via INTRAVENOUS
  Filled 2023-07-04: qty 10

## 2023-07-04 MED ORDER — LACTATED RINGERS AMNIOINFUSION: INTRAVENOUS | Status: DC

## 2023-07-04 MED ORDER — OXYTOCIN-SODIUM CHLORIDE 30-0.9 UT/500ML-% IV SOLN
1.0000 m[IU]/min | INTRAVENOUS | Status: DC
  Administered 2023-07-04: 2 m[IU]/min via INTRAVENOUS
  Filled 2023-07-04: qty 500

## 2023-07-04 MED ORDER — OXYTOCIN-SODIUM CHLORIDE 30-0.9 UT/500ML-% IV SOLN: 2.5000 [IU]/h | INTRAVENOUS | Status: DC

## 2023-07-04 MED ORDER — LIDOCAINE HCL (PF) 1 % IJ SOLN: 30.0000 mL | INTRAMUSCULAR | Status: DC | PRN

## 2023-07-04 MED ORDER — VENLAFAXINE HCL ER 75 MG PO CP24
75.0000 mg | ORAL_CAPSULE | Freq: Every morning | ORAL | Status: DC
  Administered 2023-07-06 – 2023-07-09 (×4): 75 mg via ORAL
  Filled 2023-07-04 (×5): qty 1

## 2023-07-04 MED ORDER — SOD CITRATE-CITRIC ACID 500-334 MG/5ML PO SOLN
30.0000 mL | ORAL | Status: DC | PRN
  Administered 2023-07-05: 30 mL via ORAL
  Filled 2023-07-04: qty 30

## 2023-07-04 MED ORDER — LIDOCAINE-EPINEPHRINE (PF) 1.5 %-1:200000 IJ SOLN
INTRAMUSCULAR | Status: DC | PRN
Start: 2023-07-04 — End: 2023-07-05
  Administered 2023-07-04: 5 mL via EPIDURAL

## 2023-07-04 MED ORDER — OXYTOCIN BOLUS FROM INFUSION: 333.0000 mL | Freq: Once | INTRAVENOUS | Status: DC

## 2023-07-04 MED ORDER — LACTATED RINGERS IV SOLN
500.0000 mL | INTRAVENOUS | Status: AC | PRN
  Administered 2023-07-04 (×2): 500 mL via INTRAVENOUS

## 2023-07-04 MED ORDER — FENTANYL-BUPIVACAINE-NACL 0.5-0.125-0.9 MG/250ML-% EP SOLN
12.0000 mL/h | EPIDURAL | Status: DC | PRN
  Administered 2023-07-04 – 2023-07-05 (×2): 12 mL/h via EPIDURAL
  Filled 2023-07-04 (×2): qty 250

## 2023-07-04 MED ORDER — ONDANSETRON HCL 4 MG/2ML IJ SOLN
4.0000 mg | Freq: Four times a day (QID) | INTRAMUSCULAR | Status: DC | PRN
  Administered 2023-07-04: 4 mg via INTRAVENOUS
  Filled 2023-07-04: qty 2

## 2023-07-04 MED ORDER — SODIUM CHLORIDE 0.9 % IV SOLN
5.0000 10*6.[IU] | Freq: Once | INTRAVENOUS | Status: AC
  Administered 2023-07-04: 5 10*6.[IU] via INTRAVENOUS
  Filled 2023-07-04: qty 5

## 2023-07-04 MED ORDER — PHENYLEPHRINE 80 MCG/ML (10ML) SYRINGE FOR IV PUSH (FOR BLOOD PRESSURE SUPPORT)
80.0000 ug | PREFILLED_SYRINGE | INTRAVENOUS | Status: DC | PRN
  Administered 2023-07-04: 80 ug via INTRAVENOUS

## 2023-07-04 MED ORDER — FENTANYL CITRATE (PF) 100 MCG/2ML IJ SOLN
50.0000 ug | INTRAMUSCULAR | Status: DC | PRN
  Administered 2023-07-04: 100 ug via INTRAVENOUS
  Filled 2023-07-04: qty 2

## 2023-07-04 MED ORDER — PENICILLIN G POT IN DEXTROSE 60000 UNIT/ML IV SOLN
3.0000 10*6.[IU] | INTRAVENOUS | Status: DC
  Administered 2023-07-04 – 2023-07-05 (×7): 3 10*6.[IU] via INTRAVENOUS
  Filled 2023-07-04 (×7): qty 50

## 2023-07-04 MED ORDER — DIPHENHYDRAMINE HCL 50 MG/ML IJ SOLN
12.5000 mg | INTRAMUSCULAR | Status: DC | PRN
  Administered 2023-07-04: 12.5 mg via INTRAVENOUS
  Filled 2023-07-04: qty 1

## 2023-07-04 MED ORDER — LACTATED RINGERS IV SOLN: INTRAVENOUS | Status: AC

## 2023-07-04 NOTE — Progress Notes (Signed)
Labor Progress Note  Kelly Lopez is a 42 y.o. G6P0140 at [redacted]w[redacted]d presented for IOL AMA  S: Feeling some discomfort with contractions, but overall, more comfortable after epidural.  O:  BP 103/65   Pulse 80   Temp 97.9 F (36.6 C) (Oral)   Resp 18   Ht 5\' 3"  (1.6 m)   Wt 100.7 kg   LMP 10/04/2022   SpO2 97%   BMI 39.31 kg/m  EFM: 130/mod/+a/variables Toco: regular, every 2 minutes   CVE: Dilation: 3 Effacement (%): 50 Cervical Position: Posterior Station: -2 Presentation: Vertex Exam by:: Lucianne Muss MD   [name]: 42 y.o. [id number] [redacted]w[redacted]d  here for IOL for AMA.  #Labor: Discussed AROM with patient, she verbally consented -- AROM performed with moderate meconium and bloody show noted #Pain: Epidural in place #FWB: CAT 2 #GBS positive, PCN   #AMA: @[redacted]w[redacted]d , CWD, normal anatomy, 2856g, 51% EFW   #Alpha-1-antitrypsin def carrier   #HSV-1: L labial outbreaks previously, no recent outbreak or prodromal syndrome, compliant with suppression  - Cont Valtrex   #MDD - continue home Effexor 75mg  QAM   #History of fetal loss   #Desires sterility: signed papers 05/26/23, still desires PP BTL   [name], MD FMOB Fellow, Faculty practice [facility], Center for [facility] 07/04/23  9:44 AM

## 2023-07-04 NOTE — Progress Notes (Signed)
Writer requested to assist in the OR, touched base with nursing, infant reactive on strip, patient w/o more pressure.  Will plan to recheck possibly restart pit once out the OR.  Nursing understanding can call me out of OR if needed sooner.   Dr. Judd Lien

## 2023-07-04 NOTE — Anesthesia Procedure Notes (Signed)
Epidural Patient location during procedure: OB Start time: 07/04/2023 8:30 AM End time: 07/04/2023 8:38 AM  Staffing Anesthesiologist: Atilano Median, DO Performed: anesthesiologist   Preanesthetic Checklist Completed: patient identified, IV checked, site marked, risks and benefits discussed, surgical consent, monitors and equipment checked, pre-op evaluation and timeout performed  Epidural Patient position: sitting Prep: ChloraPrep Patient monitoring: heart rate, continuous pulse ox and blood pressure Approach: midline Location: L3-L4 Injection technique: LOR saline  Needle:  Needle type: Tuohy  Needle gauge: 17 G Needle length: 9 cm Needle insertion depth: 7 cm Catheter type: closed end flexible Catheter size: 20 Guage Catheter at skin depth: 12 cm Test dose: negative and 1.5% lidocaine with Epi 1:200 K  Assessment Events: blood not aspirated, no cerebrospinal fluid, injection not painful, no injection resistance and no paresthesia  Additional Notes   Patient identified. Risks/Benefits/Options discussed with patient including but not limited to bleeding, infection, nerve damage, paralysis, failed block, incomplete pain control, headache, blood pressure changes, nausea, vomiting, reactions to medications, itching and postpartum back pain. Confirmed with bedside nurse the patient's most recent platelet count. Confirmed with patient that they are not currently taking any anticoagulation, have any bleeding history or any family history of bleeding disorders. Patient expressed understanding and wished to proceed. All questions were answered. Sterile technique was used throughout the entire procedure. Please see nursing notes for vital signs. Test dose was given through epidural catheter and negative prior to continuing to dose epidural or start infusion. Warning signs of high block given to the patient including shortness of breath, tingling/numbness in hands, complete motor  block, or any concerning symptoms with instructions to call for help. Patient was given instructions on fall risk and not to get out of bed. All questions and concerns addressed with instructions to call with any issues or inadequate analgesia.    Reason for block:procedure for pain

## 2023-07-04 NOTE — Anesthesia Preprocedure Evaluation (Addendum)
Anesthesia Evaluation  Patient identified by MRN, date of birth, ID band Patient awake    Reviewed: Allergy & Precautions, NPO status , Patient's Chart, lab work & pertinent test results  Airway Mallampati: II  TM Distance: >3 FB Neck ROM: Full    Dental no notable dental hx.    Pulmonary former smoker   Pulmonary exam normal        Cardiovascular negative cardio ROS  Rhythm:Regular Rate:Normal     Neuro/Psych   Anxiety Depression    negative neurological ROS     GI/Hepatic Neg liver ROS,GERD  Medicated,,  Endo/Other  negative endocrine ROS    Renal/GU negative Renal ROS  negative genitourinary   Musculoskeletal negative musculoskeletal ROS (+)    Abdominal Normal abdominal exam  (+)   Peds  Hematology Lab Results      Component                Value               Date                      WBC                      12.0 (H)            07/04/2023                HGB                      12.5                07/04/2023                HCT                      35.9 (L)            07/04/2023                MCV                      87.3                07/04/2023                PLT                      250                 07/04/2023              Anesthesia Other Findings   Reproductive/Obstetrics (+) Pregnancy                              Anesthesia Physical Anesthesia Plan  ASA: 2  Anesthesia Plan: Epidural   Post-op Pain Management:    Induction:   PONV Risk Score and Plan: 2 and Treatment may vary due to age or medical condition  Airway Management Planned: Natural Airway  Additional Equipment: None  Intra-op Plan:   Post-operative Plan:   Informed Consent: I have reviewed the patients History and Physical, chart, labs and discussed the procedure including the risks, benefits and alternatives for the proposed anesthesia with the patient or authorized representative who has  indicated his/her understanding and acceptance.  Dental advisory given  Plan Discussed with:   Anesthesia Plan Comments:         Anesthesia Quick Evaluation

## 2023-07-04 NOTE — Progress Notes (Signed)
Labor Progress Note  Kelly Lopez is a 42 y.o. G6P0140 at [redacted]w[redacted]d presented for IOL AMA  S: Not feeling ctx, no concerns or complaints at this time.  O:  BP 105/62   Pulse 75   Temp 98.1 F (36.7 C) (Oral)   Resp 18   Ht 5\' 3"  (1.6 m)   Wt 100.7 kg   LMP 10/04/2022   SpO2 95%   BMI 39.31 kg/m  EFM: 120/mod/+a/variables Toco: regular, every 2 minutes   CVE: Dilation: 4 Effacement (%): 60 Cervical Position: Posterior Station: -2 Presentation: Vertex Exam by:: Troy Sine, RN   [name]: 42 y.o. [phone] [redacted]w[redacted]d  here for IOL for AMA.  #Labor: Cont pitocin, now at 6.  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

## 2023-07-04 NOTE — Progress Notes (Signed)
Labor Progress Note  Kelly Lopez is a 42 y.o. G6P0140 at [redacted]w[redacted]d presented for IOL AMA  S: pt with decelerations with position changes, bedside to assess, pt w/o concerns, agreeable to Amnioinfusion   O:  BP 120/67   Pulse 90   Temp 99.5 F (37.5 C) (Oral)   Resp 18   Ht 5\' 3"  (1.6 m)   Wt 100.7 kg   LMP 10/04/2022   SpO2 97%   BMI 39.31 kg/m  EFM: 120/mod/+a/sporadic variables Toco: regular, every 1-2 minutes   CVE: Dilation: 8 Effacement (%): 90 Cervical Position: Posterior Station: 0 Presentation: Vertex Exam by:: Mellissa Kohut, RN   [name]: 42 y.o. [id number] [redacted]w[redacted]d  here for IOL for AMA.  #Labor: Consented and agreeable with Amnioinfusion 300bolus//125cc/hr and half rate on pit along with change from high thrown to Rside with peanut ball.  Will up titrate pitocin once fetal relief.  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

## 2023-07-04 NOTE — Progress Notes (Addendum)
Labor Progress Note  Kelly Lopez is a 42 y.o. G6P0140 at [redacted]w[redacted]d presented for IOL AMA  S: feeling more uncomfortable with contractions, no other concerns, agreeable to check and ?AROM.   O:  BP 115/63   Pulse 74   Temp 97.8 F (36.6 C) (Oral)   Resp 16   Ht 5\' 3"  (1.6 m)   Wt 100.7 kg   LMP 10/04/2022   BMI 39.31 kg/m  EFM:120bpm/Moderate variability/ 15x15 accels/ Late decel at [id number] but otherwise reactive CAT: 2 Toco: regular, every 2 minutes   CVE: Dilation: 2 Effacement (%): 50 Cervical Position: Posterior Station: -2 Presentation: Vertex Exam by:: Dr Judd Lien   [name]: 42 y.o. [id number] [redacted]w[redacted]d  here for IOL as above  #Labor: Pt still at 2cm, was not able to keep fingers engaged with the fetal head.  Will try and have another provider try and AROM.   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

## 2023-07-04 NOTE — Progress Notes (Signed)
Labor Progress Note  Kelly Lopez is a 42 y.o. G6P0140 at [redacted]w[redacted]d presented for IOL AMA  S: pt with prolonged decelerations instructed nursing to half pit from 10 to 5 and change position, bedside to assess, pt w/o concerns, nursing asking for check and assessment of IUPC as return from Amnioinfusion scant with 350cc out of bag.   O:  BP (!) 112/59   Pulse 82   Temp 99.5 F (37.5 C) (Oral)   Resp 18   Ht 5\' 3"  (1.6 m)   Wt 100.7 kg   LMP 10/04/2022   SpO2 100%   BMI 39.31 kg/m  EFM: 120/mod/+a/2-73min deceleration to 80s-90s Toco: regular, every 1-2 minutes   CVE: Dilation: Lip/rim Effacement (%): 90 Cervical Position: Posterior Station: 0 Presentation: Vertex Exam by:: Dr. Judd Lien   [name]: 42 y.o. [id number] [redacted]w[redacted]d  here for IOL for AMA.  #Labor: Pitocin turned off, position changed from R lateral to L lateral after check and found to be AntLip, infant feels asynclitic with molding but anterior fontanelle felt to be down towards pts L hip making infant most likely ROA.  Was unable to get more return from IUPC so paused amnioinfusion.  Will give 30-1hr of fetal recovery before trying pit 1x1. #Pain: Epidural in place #FWB: CAT 2 #GBS positive, PCN   #AMA: @[redacted]w[redacted]d , CWD, normal anatomy, 2856g, 51% EFW   #Alpha-1-antitrypsin def carrier   #HSV-1: L labial outbreaks previously, no recent outbreak or prodromal syndrome, compliant with suppression  - Cont Valtrex   #MDD - continue home Effexor 75mg  QAM   #History of fetal loss   #Desires sterility: signed papers 05/26/23, still desires PP BTL   Kelly Dibble, MD FMOB Fellow, Faculty practice [facility], Center for [facility] 07/04/23  10:25 PM

## 2023-07-04 NOTE — Progress Notes (Signed)
Labor Progress Note  Kelly Lopez is a 42 y.o. G6P0140 at [redacted]w[redacted]d presented for IOL AMA  S: Arrived at bedside to assess pt given change in EFM -- appears to be prolonged accels. Pt denies any increased pressure/discomfort.  O:  BP 118/70   Pulse 76   Temp 99.5 F (37.5 C) (Oral)   Resp 19   Ht 5\' 3"  (1.6 m)   Wt 100.7 kg   LMP 10/04/2022   SpO2 97%   BMI 39.31 kg/m  EFM: 130/mod/+a/sporadic variables Toco: regular, every 2-3 minutes   CVE: Dilation: 5 Effacement (%): 60 Cervical Position: Posterior Station: -2 Presentation: Vertex Exam by:: Troy Sine, RN   [name]: 42 y.o. [phone] [redacted]w[redacted]d  here for IOL for AMA.  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

## 2023-07-04 NOTE — H&P (Signed)
OBSTETRIC ADMISSION HISTORY AND PHYSICAL  Kelly Lopez is a 42 y.o. female [phone] with IUP at [redacted]w[redacted]d by LMP presenting for IOL AMA. She reports +FMs, No LOF, no VB, no blurry vision, headaches or peripheral edema, and RUQ pain.  She plans on breast feeding. She request PP BTL for birth control. She received her prenatal care at [facility]   Dating: By LMP --->  Estimated Date of Delivery: 07/11/23  Sono:    @[redacted]w[redacted]d , CWD, normal anatomy, cephalic presentation, posterior lie, 2856g, 51% EFW   Prenatal History/Complications:  Patient Active Problem List   Diagnosis Date Noted   AMA (advanced maternal age) multigravida 35+, third trimester 07/04/2023   GBS (group B Streptococcus carrier), +RV culture, currently pregnant 06/17/2023   Alpha-1-antitrypsin deficiency carrier 03/04/2023   Supervision of high risk pregnancy, antepartum 02/03/2023   AMA (advanced maternal age) multigravida 35+ 02/03/2023   HSV-1 (herpes simplex virus 1) infection 02/03/2023   Susceptible to varicella (non-immune), currently pregnant 01/04/2023   Major depressive disorder, recurrent, in partial remission (HCC) 06/19/2020   History of fetal loss 09/08/2019   Obesity 07/11/2019   NURSING  PROVIDER  Office Location [facility] Dating by LMP  [facility] Model Traditional Anatomy U/S Normal, growth q4, antenatal testing at 34 weeks  Initiated care at  17wks                 Language  English               LAB RESULTS   Support Person Sister or Mom Genetics NIPS: normal Materni21 AFP: normal (in CE)          NT/IT (FT only)        Carrier Screen Horizon: not done, normal hemoglobinopathy panel in CE  Rhogam  A/Positive/-- (03/19 0000) A1C/GTT Early:  normal (CE)            Third trimester: normal 26 wk  Flu Vaccine        TDaP Vaccine 05/05/23 Blood Type A/Positive/-- (03/19 0000)  Covid Vaccine 2 doses Antibody Negative (03/19 0000)      Rubella Immune (03/19 0000)  Feeding Plan Breast RPR Nonreactive (03/19  0000)  Contraception BTL HBsAg Negative (03/19 0000)  Circumcision Yes HIV Non-reactive (03/19 0000)  Pediatrician  List given 05/26/23 HCVAb Negative (03/19 0000)  Prenatal Classes            Pap 01/13/22 - NILM, neg HPV  BTLConsent 05/26/23 GC/CT Initial: neg/neg             36wks:  VBAC  Consent NA GBS For PCN allergy, check sensitivities            DME Rx [X]  BP cuff [ ]  Weight Scale Waterbirth  [ ]  Class [ ]  Consent [ ]  CNM visit  PHQ9 & GAD7 [X]  new OB [  ] 28 weeks  [  ] 36 weeks Induction  [ ]  Orders Entered [ ] Foley Y/N     Past Medical History: Past Medical History:  Diagnosis Date   Anxiety    Depression    Esophageal dysphagia    History of retained placenta 08/31/2019   Psoriasis    SVD (spontaneous vaginal delivery) 03/01/2022    Past Surgical History: Past Surgical History:  Procedure Laterality Date   DILATION AND EVACUATION N/A 08/31/2019   Procedure: DILATATION AND EVACUATION;  Surgeon: Tereso Newcomer, MD;  Location: [facility];  Service: Gynecology;  Laterality: N/A;   NO PAST  SURGERIES      Obstetrical History: OB History     Gravida  6   Para  1   Term  0   Preterm  1   AB  4   Living  0      SAB  2   IAB  1   Ectopic  0   Multiple  0   Live Births  0           Social History Social History   Socioeconomic History   Marital status: Single    Spouse name: Not on file   Number of children: Not on file   Years of education: Not on file   Highest education level: Associate degree: occupational, Scientist, product/process development, or vocational program  Occupational History   Not on file  Tobacco Use   Smoking status: Former    Types: Cigars   Smokeless tobacco: Never  Vaping Use   Vaping status: Never Used  Substance and Sexual Activity   Alcohol use: Not Currently   Drug use: Not Currently   Sexual activity: Yes    Birth control/protection: None  Other Topics Concern   Not on file  Social History Narrative   Not on file   Social  Determinants of Health   Financial Resource Strain: Low Risk  (03/10/2023)   Overall Financial Resource Strain (CARDIA)    Difficulty of Paying Living Expenses: Not very hard  Food Insecurity: No Food Insecurity (07/04/2023)   Hunger Vital Sign    Worried About Running Out of Food in the Last Year: Never true    Ran Out of Food in the Last Year: Never true  Transportation Needs: No Transportation Needs (07/04/2023)   PRAPARE - Administrator, Civil Service (Medical): No    Lack of Transportation (Non-Medical): No  Physical Activity: Unknown (03/10/2023)   Exercise Vital Sign    Days of Exercise per Week: 0 days    Minutes of Exercise per Session: Not on file  Stress: Stress Concern Present (03/10/2023)   [employer] of Occupational Health - Occupational Stress Questionnaire    Feeling of Stress : To some extent  Social Connections: Socially Isolated (03/10/2023)   Social Connection and Isolation Panel [NHANES]    Frequency of Communication with Friends and Family: More than three times a week    Frequency of Social Gatherings with Friends and Family: Once a week    Attends Religious Services: Never    Database administrator or Organizations: No    Attends Engineer, structural: Not on file    Marital Status: Never married    Family History: Family History  Problem Relation Age of Onset   Cancer Maternal Grandmother    Heart disease Maternal Grandfather    Cancer Paternal Grandmother    Heart disease Paternal Grandfather     Allergies: Allergies  Allergen Reactions   Sertraline Hcl Other (See Comments)   Ferrous Sulfate Rash    Medications Prior to Admission  Medication Sig Dispense Refill Last Dose   aspirin EC 81 MG tablet Take 1 tablet (81 mg total) by mouth daily. Swallow whole. 30 tablet 12 07/03/2023 at 1000   Docusate Sodium (DSS) 100 MG CAPS Take 2 capsules by mouth daily.   07/04/2023 at 1000   omeprazole (PRILOSEC) 20 MG capsule Take 20  mg by mouth daily.   07/04/2023 at 1000   prenatal vitamin w/FE, FA (PRENATAL 1 + 1) 27-1 MG TABS tablet Take  1 tablet by mouth daily at 12 noon.   07/04/2023 at 1000   valACYclovir (VALTREX) 500 MG tablet Take 1 tablet (500 mg total) by mouth daily. 30 tablet 4 07/04/2023 at 1000   venlafaxine XR (EFFEXOR-XR) 75 MG 24 hr capsule Take 75 mg by mouth every morning.   07/04/2023 at 1000     Review of Systems   All systems reviewed and negative except as stated in HPI  Blood pressure 124/78, pulse 89, temperature 99.1 F (37.3 C), temperature source Oral, resp. rate 18, height 5\' 3"  (1.6 m), weight 100.7 kg, last menstrual period 10/04/2022. General appearance: alert, cooperative, and appears stated age Lungs: clear to auscultation bilaterally Heart: regular rate and rhythm Abdomen: soft, non-tender; bowel sounds normal Pelvic: normal female genitalia, no lesions noted Extremities: Homans sign is negative, no sign of DVT Presentation: cephalic Fetal monitoringBaseline: 120 bpm, Variability: Good {> 6 bpm), Accelerations: Reactive, and Decelerations: Absent Uterine activity q85mins Dilation: 1.5 Effacement (%): 20 Station: -3 Exam by:: Dr. Judd Lien   Prenatal labs: ABO, Rh: --/--/A POS (10/14 1610) Antibody: NEG (10/14 0043) Rubella: Immune (03/19 0000) RPR: Non Reactive (07/18 0913)  HBsAg: Negative (03/19 0000)  HIV: Non Reactive (07/18 0913)  GBS: Positive/-- (09/24 0918)  2 hr Glucola wnl Genetic screening  normal, Alpha-1-antitrypsin def carrier  Anatomy US normal   Prenatal Transfer Tool  Maternal Diabetes: No Genetic Screening: Normal, Alpha-1-antitrypsin def carrier Maternal Ultrasounds/Referrals: Normal Fetal Ultrasounds or other Referrals:  None Maternal Substance Abuse:  No Significant Maternal Medications:  Meds include: Other:  Valtrex, Effexor Significant Maternal Lab Results:  Group B Strep positive Number of Prenatal Visits:greater than 3 verified prenatal  visits Other Comments:  None  Results for orders placed or performed during the hospital encounter of 07/04/23 (from the past 24 hour(s))  CBC   Collection Time: 07/04/23 12:43 AM  Result Value Ref Range   WBC 12.0 (H) 4.0 - 10.5 K/uL   RBC 4.11 3.87 - 5.11 MIL/uL   Hemoglobin 12.5 12.0 - 15.0 g/dL   HCT 96.0 (L) 45.4 - 09.8 %   MCV 87.3 80.0 - 100.0 fL   MCH 30.4 26.0 - 34.0 pg   MCHC 34.8 30.0 - 36.0 g/dL   RDW 11.9 14.7 - 82.9 %   Platelets 250 150 - 400 K/uL   nRBC 0.0 0.0 - 0.2 %  Type and screen   Collection Time: 07/04/23 12:43 AM  Result Value Ref Range   ABO/RH(D) A POS    Antibody Screen NEG    Sample Expiration      07/07/2023,2359 Performed at [facility], [address], [place], [place] [address]     Patient Active Problem List   Diagnosis Date Noted   AMA (advanced maternal age) multigravida 35+, third trimester 07/04/2023   GBS (group B Streptococcus carrier), +RV culture, currently pregnant 06/17/2023   Alpha-1-antitrypsin deficiency carrier 03/04/2023   Supervision of high risk pregnancy, antepartum 02/03/2023   AMA (advanced maternal age) multigravida 35+ 02/03/2023   HSV-1 (herpes simplex virus 1) infection 02/03/2023   Susceptible to varicella (non-immune), currently pregnant 01/04/2023   Major depressive disorder, recurrent, in partial remission (HCC) 06/19/2020   History of fetal loss 09/08/2019   Obesity 07/11/2019    Assessment/Plan:  Kelly Lopez is a 42 y.o. G6P0140 at [redacted]w[redacted]d here for IOL for AMA  #Labor:FB 40cc, ?Vaginal Cytotec if uterine activity spaces.  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

## 2023-07-05 ENCOUNTER — Other Ambulatory Visit: Payer: Self-pay

## 2023-07-05 ENCOUNTER — Encounter (HOSPITAL_COMMUNITY)
Admission: RE | Disposition: A | Discharge status: Home / Self Care | Payer: Self-pay | Source: Home / Self Care | Attending: Obstetrics and Gynecology

## 2023-07-05 ENCOUNTER — Encounter (HOSPITAL_COMMUNITY): Payer: Self-pay | Admitting: Family Medicine

## 2023-07-05 ENCOUNTER — Inpatient Hospital Stay (HOSPITAL_COMMUNITY): Payer: No Typology Code available for payment source

## 2023-07-05 DIAGNOSIS — O09523 Supervision of elderly multigravida, third trimester: Secondary | ICD-10-CM

## 2023-07-05 DIAGNOSIS — Z302 Encounter for sterilization: Secondary | ICD-10-CM

## 2023-07-05 DIAGNOSIS — O99820 Streptococcus B carrier state complicating pregnancy: Secondary | ICD-10-CM

## 2023-07-05 DIAGNOSIS — Z3A39 39 weeks gestation of pregnancy: Secondary | ICD-10-CM

## 2023-07-05 DIAGNOSIS — O9832 Other infections with a predominantly sexual mode of transmission complicating childbirth: Secondary | ICD-10-CM

## 2023-07-05 DIAGNOSIS — O99344 Other mental disorders complicating childbirth: Secondary | ICD-10-CM

## 2023-07-05 DIAGNOSIS — Z3A00 Weeks of gestation of pregnancy not specified: Secondary | ICD-10-CM | POA: Diagnosis not present

## 2023-07-05 DIAGNOSIS — Z9079 Acquired absence of other genital organ(s): Secondary | ICD-10-CM

## 2023-07-05 DIAGNOSIS — O99214 Obesity complicating childbirth: Secondary | ICD-10-CM

## 2023-07-05 HISTORY — PX: IR ANGIOGRAM SELECTIVE EACH ADDITIONAL VESSEL: IMG667

## 2023-07-05 HISTORY — PX: IR EMBO ART  VEN HEMORR LYMPH EXTRAV  INC GUIDE ROADMAPPING: IMG5450

## 2023-07-05 HISTORY — PX: TUBAL LIGATION: SHX77

## 2023-07-05 HISTORY — PX: IR FLUORO GUIDE CV LINE RIGHT: IMG2283

## 2023-07-05 HISTORY — PX: IR ANGIOGRAM PELVIS SELECTIVE OR SUPRASELECTIVE: IMG661

## 2023-07-05 HISTORY — PX: IR US GUIDE VASC ACCESS RIGHT: IMG2390

## 2023-07-05 LAB — MRSA NEXT GEN BY PCR, NASAL: MRSA by PCR Next Gen: NOT DETECTED

## 2023-07-05 LAB — HEMOGLOBIN AND HEMATOCRIT, BLOOD
HCT: 23.8 % — ABNORMAL LOW (ref 36.0–46.0)
HCT: 30.6 % — ABNORMAL LOW (ref 36.0–46.0)
HCT: 26.5 % — ABNORMAL LOW (ref 36.0–46.0)
Hemoglobin: 8.2 g/dL — ABNORMAL LOW (ref 12.0–15.0)
Hemoglobin: 10.9 g/dL — ABNORMAL LOW (ref 12.0–15.0)
Hemoglobin: 9.3 g/dL — ABNORMAL LOW (ref 12.0–15.0)

## 2023-07-05 LAB — POCT I-STAT EG7
Acid-base deficit: 7 mmol/L — ABNORMAL HIGH (ref 0.0–2.0)
Bicarbonate: 19 mmol/L — ABNORMAL LOW (ref 20.0–28.0)
Calcium, Ion: 1.1 mmol/L — ABNORMAL LOW (ref 1.15–1.40)
HCT: 21 % — ABNORMAL LOW (ref 36.0–46.0)
Hemoglobin: 7.1 g/dL — ABNORMAL LOW (ref 12.0–15.0)
O2 Saturation: 28 %
Potassium: 4.8 mmol/L (ref 3.5–5.1)
Sodium: 136 mmol/L (ref 135–145)
TCO2: 20 mmol/L — ABNORMAL LOW (ref 22–32)
pCO2, Ven: 42.2 mm[Hg] — ABNORMAL LOW (ref 44–60)
pH, Ven: 7.262 (ref 7.25–7.43)
pO2, Ven: 21 mm[Hg] — CL (ref 32–45)

## 2023-07-05 LAB — PREPARE CRYOPRECIPITATE: Unit division: 0

## 2023-07-05 LAB — BASIC METABOLIC PANEL
Anion gap: 12 (ref 5–15)
BUN: 12 mg/dL (ref 6–20)
CO2: 19 mmol/L — ABNORMAL LOW (ref 22–32)
Calcium: 8.2 mg/dL — ABNORMAL LOW (ref 8.9–10.3)
Chloride: 103 mmol/L (ref 98–111)
Creatinine, Ser: 1.6 mg/dL — ABNORMAL HIGH (ref 0.44–1.00)
GFR, Estimated: 42 mL/min — ABNORMAL LOW (ref >60)
Glucose, Bld: 94 mg/dL (ref 70–99)
Potassium: 3.8 mmol/L (ref 3.5–5.1)
Sodium: 134 mmol/L — ABNORMAL LOW (ref 135–145)

## 2023-07-05 LAB — DIC (DISSEMINATED INTRAVASCULAR COAGULATION)PANEL
D-Dimer, Quant: 2.98 ug{FEU}/mL — ABNORMAL HIGH (ref 0.00–0.50)
D-Dimer, Quant: 6.23 ug{FEU}/mL — ABNORMAL HIGH (ref 0.00–0.50)
D-Dimer, Quant: 11.52 ug{FEU}/mL — ABNORMAL HIGH (ref 0.00–0.50)
D-Dimer, Quant: 12.71 ug{FEU}/mL — ABNORMAL HIGH (ref 0.00–0.50)
Fibrinogen: 468 mg/dL (ref 210–475)
Fibrinogen: 302 mg/dL (ref 210–475)
Fibrinogen: 187 mg/dL — ABNORMAL LOW (ref 210–475)
Fibrinogen: 178 mg/dL — ABNORMAL LOW (ref 210–475)
INR: 1.1 (ref 0.8–1.2)
INR: 1.4 — ABNORMAL HIGH (ref 0.8–1.2)
INR: 1.6 — ABNORMAL HIGH (ref 0.8–1.2)
INR: 1.6 — ABNORMAL HIGH (ref 0.8–1.2)
Platelets: 206 10*3/uL (ref 150–400)
Platelets: 209 10*3/uL (ref 150–400)
Platelets: 160 10*3/uL (ref 150–400)
Platelets: 119 10*3/uL — ABNORMAL LOW (ref 150–400)
Prothrombin Time: 14.6 s (ref 11.4–15.2)
Prothrombin Time: 17 s — ABNORMAL HIGH (ref 11.4–15.2)
Prothrombin Time: 19.3 s — ABNORMAL HIGH (ref 11.4–15.2)
Prothrombin Time: 19.7 s — ABNORMAL HIGH (ref 11.4–15.2)
Smear Review: NONE SEEN
Smear Review: NONE SEEN
Smear Review: NONE SEEN
Smear Review: NONE SEEN
aPTT: 29 s (ref 24–36)
aPTT: 37 s — ABNORMAL HIGH (ref 24–36)
aPTT: 36 s (ref 24–36)
aPTT: 33 s (ref 24–36)

## 2023-07-05 LAB — CBC
HCT: 36.8 % (ref 36.0–46.0)
Hemoglobin: 12.6 g/dL (ref 12.0–15.0)
MCH: 30.5 pg (ref 26.0–34.0)
MCHC: 34.2 g/dL (ref 30.0–36.0)
MCV: 89.1 fL (ref 80.0–100.0)
Platelets: 220 10*3/uL (ref 150–400)
RBC: 4.13 MIL/uL (ref 3.87–5.11)
RDW: 12.5 % (ref 11.5–15.5)
WBC: 16.9 10*3/uL — ABNORMAL HIGH (ref 4.0–10.5)
nRBC: 0 % (ref 0.0–0.2)

## 2023-07-05 LAB — GLUCOSE, CAPILLARY
Glucose-Capillary: 120 mg/dL — ABNORMAL HIGH (ref 70–99)
Glucose-Capillary: 115 mg/dL — ABNORMAL HIGH (ref 70–99)

## 2023-07-05 LAB — PREPARE RBC (CROSSMATCH)

## 2023-07-05 LAB — BPAM CRYOPRECIPITATE
Blood Product Expiration Date: 202410202359
Unit Type and Rh: 6200

## 2023-07-05 SURGERY — Surgical Case
Anesthesia: Epidural | Site: Abdomen

## 2023-07-05 MED ORDER — SODIUM CHLORIDE 0.9 % IR SOLN
Status: DC | PRN
  Administered 2023-07-05: 1

## 2023-07-05 MED ORDER — SODIUM CHLORIDE 0.9 % IV SOLN: 10.0000 mL/h | Freq: Once | INTRAVENOUS | Status: AC

## 2023-07-05 MED ORDER — ONDANSETRON HCL 4 MG/2ML IJ SOLN
INTRAMUSCULAR | Status: AC
  Filled 2023-07-05: qty 2

## 2023-07-05 MED ORDER — SODIUM CHLORIDE 0.9 % IV SOLN
500.0000 mg | INTRAVENOUS | Status: AC
  Administered 2023-07-05: 500 mg via INTRAVENOUS

## 2023-07-05 MED ORDER — CEFAZOLIN SODIUM-DEXTROSE 2-4 GM/100ML-% IV SOLN
2.0000 g | INTRAVENOUS | Status: AC
  Administered 2023-07-05 (×3): 2 g via INTRAVENOUS

## 2023-07-05 MED ORDER — MISOPROSTOL 200 MCG PO TABS
ORAL_TABLET | ORAL | Status: AC
  Filled 2023-07-05: qty 5

## 2023-07-05 MED ORDER — LIDOCAINE HCL 1 % IJ SOLN
INTRAMUSCULAR | Status: AC
  Filled 2023-07-05: qty 20

## 2023-07-05 MED ORDER — ONDANSETRON HCL 4 MG/2ML IJ SOLN: 4.0000 mg | Freq: Once | INTRAMUSCULAR | Status: DC | PRN

## 2023-07-05 MED ORDER — OXYTOCIN-SODIUM CHLORIDE 30-0.9 UT/500ML-% IV SOLN: 1.0000 m[IU]/min | INTRAVENOUS | Status: DC

## 2023-07-05 MED ORDER — ACETAMINOPHEN 160 MG/5ML PO SOLN: 325.0000 mg | ORAL | Status: DC | PRN

## 2023-07-05 MED ORDER — CEFAZOLIN SODIUM-DEXTROSE 2-4 GM/100ML-% IV SOLN
INTRAVENOUS | Status: AC
  Filled 2023-07-05: qty 100

## 2023-07-05 MED ORDER — SODIUM BICARBONATE 8.4 % IV SOLN
INTRAVENOUS | Status: DC | PRN
  Administered 2023-07-05: 8 mL via EPIDURAL
  Administered 2023-07-05 (×2): 4 mL via EPIDURAL

## 2023-07-05 MED ORDER — OXYCODONE HCL 5 MG PO TABS: 5.0000 mg | ORAL_TABLET | Freq: Once | ORAL | Status: DC | PRN

## 2023-07-05 MED ORDER — FENTANYL CITRATE (PF) 100 MCG/2ML IJ SOLN
25.0000 ug | INTRAMUSCULAR | Status: DC | PRN
  Administered 2023-07-05: 50 ug via INTRAVENOUS

## 2023-07-05 MED ORDER — PHENYLEPHRINE HCL-NACL 20-0.9 MG/250ML-% IV SOLN
INTRAVENOUS | Status: DC | PRN
Start: 2023-07-05 — End: 2023-07-05
  Administered 2023-07-05: 60 ug/min via INTRAVENOUS

## 2023-07-05 MED ORDER — STERILE WATER FOR IRRIGATION IR SOLN
Status: DC | PRN
  Administered 2023-07-05: 1

## 2023-07-05 MED ORDER — DOCUSATE SODIUM 100 MG PO CAPS: 100.0000 mg | ORAL_CAPSULE | Freq: Two times a day (BID) | ORAL | Status: DC | PRN

## 2023-07-05 MED ORDER — METOCLOPRAMIDE HCL 5 MG/ML IJ SOLN
INTRAMUSCULAR | Status: DC | PRN
  Administered 2023-07-05: 10 mg via INTRAVENOUS

## 2023-07-05 MED ORDER — OXYCODONE HCL 5 MG/5ML PO SOLN: 5.0000 mg | Freq: Once | ORAL | Status: DC | PRN

## 2023-07-05 MED ORDER — ONDANSETRON HCL 4 MG/2ML IJ SOLN
INTRAMUSCULAR | Status: DC | PRN
  Administered 2023-07-05: 4 mg via INTRAVENOUS

## 2023-07-05 MED ORDER — SODIUM CHLORIDE 0.9% IV SOLUTION: Freq: Once | INTRAVENOUS | Status: DC

## 2023-07-05 MED ORDER — EPHEDRINE 5 MG/ML INJ
INTRAVENOUS | Status: AC
  Filled 2023-07-05: qty 5

## 2023-07-05 MED ORDER — IOHEXOL 300 MG/ML  SOLN
150.0000 mL | Freq: Once | INTRAMUSCULAR | Status: AC | PRN
  Administered 2023-07-05: 60 mL via INTRA_ARTERIAL

## 2023-07-05 MED ORDER — IOHEXOL 300 MG/ML  SOLN
50.0000 mL | Freq: Once | INTRAMUSCULAR | Status: AC | PRN
  Administered 2023-07-05: 15 mL via INTRA_ARTERIAL

## 2023-07-05 MED ORDER — ALBUMIN HUMAN 5 % IV SOLN
INTRAVENOUS | Status: AC
  Filled 2023-07-05: qty 250

## 2023-07-05 MED ORDER — ALBUMIN HUMAN 5 % IV SOLN
INTRAVENOUS | Status: DC | PRN
Start: 2023-07-05 — End: 2023-07-05

## 2023-07-05 MED ORDER — OXYTOCIN-SODIUM CHLORIDE 30-0.9 UT/500ML-% IV SOLN
INTRAVENOUS | Status: AC
  Filled 2023-07-05: qty 500

## 2023-07-05 MED ORDER — METHYLERGONOVINE MALEATE 0.2 MG/ML IJ SOLN
INTRAMUSCULAR | Status: AC
  Filled 2023-07-05: qty 1

## 2023-07-05 MED ORDER — LACTATED RINGERS IV SOLN
INTRAVENOUS | Status: DC | PRN
Start: 2023-07-05 — End: 2023-07-05

## 2023-07-05 MED ORDER — ACETAMINOPHEN 325 MG PO TABS: 325.0000 mg | ORAL_TABLET | ORAL | Status: DC | PRN

## 2023-07-05 MED ORDER — MORPHINE SULFATE (PF) 0.5 MG/ML IJ SOLN
INTRAMUSCULAR | Status: AC
  Filled 2023-07-05: qty 10

## 2023-07-05 MED ORDER — DEXAMETHASONE SODIUM PHOSPHATE 4 MG/ML IJ SOLN
INTRAMUSCULAR | Status: DC | PRN
  Administered 2023-07-05: 8 mg via INTRAVENOUS

## 2023-07-05 MED ORDER — PHENYLEPHRINE HCL-NACL 20-0.9 MG/250ML-% IV SOLN
INTRAVENOUS | Status: AC
  Filled 2023-07-05: qty 250

## 2023-07-05 MED ORDER — CALCIUM CHLORIDE 10 % IV SOLN
INTRAVENOUS | Status: AC
  Filled 2023-07-05: qty 10

## 2023-07-05 MED ORDER — MEPERIDINE HCL 25 MG/ML IJ SOLN: 6.2500 mg | INTRAMUSCULAR | Status: DC | PRN

## 2023-07-05 MED ORDER — GELATIN ABSORBABLE 12-7 MM EX MISC
CUTANEOUS | Status: AC
  Filled 2023-07-05: qty 1

## 2023-07-05 MED ORDER — TRANEXAMIC ACID-NACL 1000-0.7 MG/100ML-% IV SOLN
INTRAVENOUS | Status: DC | PRN
  Administered 2023-07-05: 1000 mg via INTRAVENOUS

## 2023-07-05 MED ORDER — ACETAMINOPHEN 10 MG/ML IV SOLN
INTRAVENOUS | Status: DC | PRN
  Administered 2023-07-05: 1000 mg via INTRAVENOUS

## 2023-07-05 MED ORDER — DIPHENHYDRAMINE HCL 25 MG PO CAPS
50.0000 mg | ORAL_CAPSULE | Freq: Once | ORAL | Status: AC | PRN
  Administered 2023-07-05: 50 mg via ORAL
  Filled 2023-07-05: qty 2

## 2023-07-05 MED ORDER — MISOPROSTOL 200 MCG PO TABS
1000.0000 ug | ORAL_TABLET | ORAL | Status: AC
  Administered 2023-07-05: 1000 ug via RECTAL

## 2023-07-05 MED ORDER — FENTANYL CITRATE (PF) 100 MCG/2ML IJ SOLN
100.0000 ug | Freq: Once | INTRAMUSCULAR | Status: AC
  Administered 2023-07-05: 100 ug via INTRAVENOUS

## 2023-07-05 MED ORDER — OXYTOCIN-SODIUM CHLORIDE 30-0.9 UT/500ML-% IV SOLN
1.0000 m[IU]/min | INTRAVENOUS | Status: DC
  Administered 2023-07-05: 1 m[IU]/min via INTRAVENOUS

## 2023-07-05 MED ORDER — METHYLERGONOVINE MALEATE 0.2 MG/ML IJ SOLN
INTRAMUSCULAR | Status: DC | PRN
  Administered 2023-07-05: .2 mg via INTRAMUSCULAR

## 2023-07-05 MED ORDER — OXYTOCIN-SODIUM CHLORIDE 30-0.9 UT/500ML-% IV SOLN
INTRAVENOUS | Status: DC | PRN
  Administered 2023-07-05: 400 mL via INTRAVENOUS

## 2023-07-05 MED ORDER — METHYLERGONOVINE MALEATE 0.2 MG/ML IJ SOLN
0.2000 mg | INTRAMUSCULAR | Status: AC
  Administered 2023-07-05: 0.2 mg via INTRAMUSCULAR

## 2023-07-05 MED ORDER — SOD CITRATE-CITRIC ACID 500-334 MG/5ML PO SOLN: 30.0000 mL | ORAL | Status: DC

## 2023-07-05 MED ORDER — FENTANYL CITRATE (PF) 100 MCG/2ML IJ SOLN
INTRAMUSCULAR | Status: DC | PRN
  Administered 2023-07-05: 100 ug via EPIDURAL

## 2023-07-05 MED ORDER — PHENYLEPHRINE HCL (PRESSORS) 10 MG/ML IV SOLN
INTRAVENOUS | Status: DC | PRN
  Administered 2023-07-05: 80 ug via INTRAVENOUS
  Administered 2023-07-05 (×2): 160 ug via INTRAVENOUS
  Administered 2023-07-05 (×2): 80 ug via INTRAVENOUS
  Administered 2023-07-05 (×4): 160 ug via INTRAVENOUS

## 2023-07-05 MED ORDER — DEXMEDETOMIDINE HCL IN NACL 80 MCG/20ML IV SOLN
INTRAVENOUS | Status: AC
  Filled 2023-07-05: qty 20

## 2023-07-05 MED ORDER — SCOPOLAMINE 1 MG/3DAYS TD PT72
MEDICATED_PATCH | TRANSDERMAL | Status: DC | PRN
  Administered 2023-07-05: 1 via TRANSDERMAL

## 2023-07-05 MED ORDER — FENTANYL CITRATE (PF) 100 MCG/2ML IJ SOLN
INTRAMUSCULAR | Status: AC
  Filled 2023-07-05: qty 2

## 2023-07-05 MED ORDER — CHLORHEXIDINE GLUCONATE CLOTH 2 % EX PADS
6.0000 | MEDICATED_PAD | Freq: Every day | CUTANEOUS | Status: DC
  Administered 2023-07-05: 6 via TOPICAL

## 2023-07-05 MED ORDER — EPHEDRINE SULFATE (PRESSORS) 50 MG/ML IJ SOLN
INTRAMUSCULAR | Status: DC | PRN
Start: 2023-07-05 — End: 2023-07-05
  Administered 2023-07-05: 5 mg via INTRAVENOUS

## 2023-07-05 MED ORDER — CHLORHEXIDINE GLUCONATE CLOTH 2 % EX PADS: 6.0000 | MEDICATED_PAD | Freq: Every day | CUTANEOUS | Status: DC

## 2023-07-05 MED ORDER — GELATIN ABSORBABLE 12-7 MM EX MISC
CUTANEOUS | Status: AC
  Filled 2023-07-05: qty 2

## 2023-07-05 MED ORDER — PHENYLEPHRINE 80 MCG/ML (10ML) SYRINGE FOR IV PUSH (FOR BLOOD PRESSURE SUPPORT)
PREFILLED_SYRINGE | INTRAVENOUS | Status: AC
  Filled 2023-07-05: qty 10

## 2023-07-05 MED ORDER — DEXAMETHASONE SODIUM PHOSPHATE 4 MG/ML IJ SOLN
INTRAMUSCULAR | Status: AC
  Filled 2023-07-05: qty 1

## 2023-07-05 MED ORDER — POLYETHYLENE GLYCOL 3350 17 G PO PACK
17.0000 g | PACK | Freq: Every day | ORAL | Status: DC | PRN
  Administered 2023-07-06: 17 g via ORAL
  Filled 2023-07-05: qty 1

## 2023-07-05 MED ORDER — CALCIUM CHLORIDE 10 % IV SOLN
INTRAVENOUS | Status: DC | PRN
Start: 2023-07-05 — End: 2023-07-05
  Administered 2023-07-05: 1 g via INTRAVENOUS

## 2023-07-05 MED ORDER — ACETAMINOPHEN 10 MG/ML IV SOLN
INTRAVENOUS | Status: AC
  Filled 2023-07-05: qty 600

## 2023-07-05 MED ORDER — MORPHINE SULFATE (PF) 0.5 MG/ML IJ SOLN
INTRAMUSCULAR | Status: DC | PRN
  Administered 2023-07-05: 3 mg via EPIDURAL

## 2023-07-05 MED ORDER — METOCLOPRAMIDE HCL 5 MG/ML IJ SOLN
INTRAMUSCULAR | Status: AC
  Filled 2023-07-05: qty 2

## 2023-07-05 SURGICAL SUPPLY — 36 items
APL PRP STRL LF DISP 70% ISPRP (MISCELLANEOUS) ×4
APL SKNCLS STERI-STRIP NONHPOA (GAUZE/BANDAGES/DRESSINGS) ×2
BENZOIN TINCTURE PRP APPL 2/3 (GAUZE/BANDAGES/DRESSINGS) IMPLANT
CHLORAPREP W/TINT 26 (MISCELLANEOUS) ×6 IMPLANT
CLAMP UMBILICAL CORD (MISCELLANEOUS) ×3 IMPLANT
CLOTH BEACON ORANGE TIMEOUT ST (SAFETY) ×3 IMPLANT
DRSG OPSITE POSTOP 4X10 (GAUZE/BANDAGES/DRESSINGS) ×3 IMPLANT
ELECT REM PT RETURN 9FT ADLT (ELECTROSURGICAL) ×2
ELECTRODE REM PT RTRN 9FT ADLT (ELECTROSURGICAL) ×3 IMPLANT
EXTRACTOR VACUUM KIWI (MISCELLANEOUS) IMPLANT
GLOVE SURG ORTHO 8.0 STRL STRW (GLOVE) ×3 IMPLANT
GOWN STRL REUS W/TWL LRG LVL3 (GOWN DISPOSABLE) ×6 IMPLANT
HEMOSTAT ARISTA ABSORB 3G PWDR (HEMOSTASIS) IMPLANT
KIT ABG SYR 3ML LUER SLIP (SYRINGE) IMPLANT
LIGASURE IMPACT 36 18CM CVD LR (INSTRUMENTS) IMPLANT
NDL HYPO 25X5/8 SAFETYGLIDE (NEEDLE) IMPLANT
NEEDLE HYPO 25X5/8 SAFETYGLIDE (NEEDLE) IMPLANT
NS IRRIG 1000ML POUR BTL (IV SOLUTION) ×3 IMPLANT
PACK C SECTION WH (CUSTOM PROCEDURE TRAY) ×3 IMPLANT
PAD OB MATERNITY 4.3X12.25 (PERSONAL CARE ITEMS) ×3 IMPLANT
RTRCTR C-SECT PINK 25CM LRG (MISCELLANEOUS) IMPLANT
STRIP CLOSURE SKIN 1/2X4 (GAUZE/BANDAGES/DRESSINGS) IMPLANT
SUT MNCRL AB 0 CT1 27 (SUTURE) IMPLANT
SUT MON AB-0 CT1 36 (SUTURE) ×6 IMPLANT
SUT PLAIN 0 NONE (SUTURE) IMPLANT
SUT PLAIN 2 0 (SUTURE) ×2
SUT PLAIN ABS 2-0 CT1 27XMFL (SUTURE) IMPLANT
SUT VIC AB 0 CT1 27 (SUTURE) ×4
SUT VIC AB 0 CT1 27XBRD ANBCTR (SUTURE) ×6 IMPLANT
SUT VIC AB 2-0 CT1 27 (SUTURE) ×2
SUT VIC AB 2-0 CT1 TAPERPNT 27 (SUTURE) ×3 IMPLANT
SUT VIC AB 4-0 SH 27 (SUTURE) ×2
SUT VIC AB 4-0 SH 27XANBCTRL (SUTURE) ×3 IMPLANT
TOWEL OR 17X24 6PK STRL BLUE (TOWEL DISPOSABLE) ×3 IMPLANT
TRAY FOLEY W/BAG SLVR 14FR LF (SET/KITS/TRAYS/PACK) ×3 IMPLANT
WATER STERILE IRR 1000ML POUR (IV SOLUTION) ×3 IMPLANT

## 2023-07-05 NOTE — Sedation Documentation (Signed)
Pt arrived to procedure room 2, with anesthesia team. Pt moved to procedure table, secured. Under the care of anesthesia.

## 2023-07-05 NOTE — Progress Notes (Signed)
Late entry at 1600:  Note is being written at this time as I have been with the patient through the entirety of the acute phase of the event.  Post surgery I went to evaluate the patient in the [facility] PACU.  Upon my arrival,most of the surgical and recovery team were present along with the anesthesiologist.  A severe postpartum hemorrhage had started and she was being assessed.  The team was already about to call me when I arrived.  There appeared to be a constant trickle from the vagina when I saw the patient.  Uterine fundus felt somewhat boggy and decision was made for JADA device insertion.    I donned sterile gloves and removed a significant amount of clot during my uterine sweep.  I could feel more clot anterior but could not reach it to dislodge it due to the size of my hands.  I called my partner Dr. Charlotta Newton and she was able to remove the residual clot and place the JADA device.  Pt was simultaneously receiving another dose of IM methergine and rectal cytotec, 1000 mcg.  DIC panel was ordered at this time.  The JADA device was activated at around 1210 and the uterus did firm up, but a significant amount of blood was accumulating in the canister which is not the norm. At this point paint had received 2 units of blood and was starting a third. Pt was on pressors to maintain BP. Due to the continued bleeding, a unit of FFP was given to address a developing coagulopathy.  Due to continued bleeding, interventional radiology was called to assess the patient.  Pt was seen by IR by 1250 and in the Interventional radiology suite by 110.  While in the IR suite the patient received another 2 units of blood and a unit cryoprecipitate and another unit of FFP.  See procedure note from interventional radiology.  The uterus was successfully embolized.  At this point the patient had received 8 units of blood, 2 units of FFP, and 1 unit of cryoprecipitate.  Once stable, the patient was moved to the Main OR PACU pending a  bed in  the ICU.  The critical care team has been consulted and they have been in to see the patient.  There appeared to be some light bleeding through the JADA tube, but once the tube and canister were exchanged there was no continued bleeding. Repeat DIC panel showed decrease in fibrinogen  and plts.  Pt is receiving a unit of cryoprecipitate now and a unit of platelets. Pt has been conscious the entirety of the event and is relatively comfortable.  Vitals are currently stable with pressors.  Pt is being transferred to the ICU at this time.  Mariel Aloe, MD

## 2023-07-05 NOTE — Progress Notes (Signed)
MD went to reassess patient in postop.  Upon arrival OR team and anesthesia were assessing.  Note incomplete.  Went to see patient during this note.  Please see subsequent note for  complete details. Mariel Aloe, MD

## 2023-07-05 NOTE — Procedures (Signed)
Interventional Radiology Procedure Note  Procedure:   US guided right CFV access, CV catheter triple lumen.   US guided right CFA access Pelvic angiogram Non-selective left internal iliac artery embolization with gelfoam to stasis Non-selective right anterior division int iliac artery embo with gelfoam to stasis.  CELT for hemostasis of the right CFA access site  Complications: None  Recommendations:  - Stable to PACU bed 8 - right hip straight while the CVC is in place. VIR to follow and remove when complete - routine wound care - CELT has indication for immediate ambulation, however, CVC in place in the right CFV - Do not submerge for 7 days - Routine CVC care - VIR to follow   Signed,  Yvone Neu. Loreta Ave, DO, ABVM, RPVI

## 2023-07-05 NOTE — Progress Notes (Addendum)
Per nursing poor UOP, tried bladder scanning with different results each time ranging from 80-270, tried repositioning catheter w/o return.  Replaced catheter with 30cc out.    A/P: Potential prerenal AKI so will investigate with BMP to assess Cr, will also give 500cc bolus.  CTM UOP.   UPDATE: Cr 1.6 BUN 12 Less likely prerenal given low BUN making obstructive given pregnancy most likely. Pt remains asymptomatic so will continue to monitoring given expectant delivery soon.   Mittie Bodo, MD Family Medicine - Obstetrics Fellow

## 2023-07-05 NOTE — Progress Notes (Signed)
eLink Physician-Brief Progress Note Patient Name: Kelly Lopez DOB: 16-May-1981 MRN: [id number]   Date of Service  07/05/2023  HPI/Events of Note  RN reports patient complaining of being itchy, asking for PO benadryl  eICU Interventions  Benadryl 50 mg PO x 1 ordered     Intervention Category Minor Interventions: Routine modifications to care plan (e.g. PRN medications for pain, fever)  Rosalie Gums Corbyn Wildey 07/05/2023, 9:01 PM

## 2023-07-05 NOTE — Anesthesia Postprocedure Evaluation (Signed)
Anesthesia Post Note  Patient: Kelly Lopez  Procedure(s) Performed: CESAREAN SECTION BILATERAL TUBAL LIGATION (Bilateral: Abdomen)     Patient location during evaluation: Mother Baby Anesthesia Type: Epidural Level of consciousness: awake and alert Pain management: pain level controlled Vital Signs Assessment: post-procedure vital signs reviewed and stable Respiratory status: spontaneous breathing, nonlabored ventilation and respiratory function stable Cardiovascular status: stable Postop Assessment: no headache, no backache and epidural receding Anesthetic complications: no   No notable events documented.  Last Vitals:  Vitals:   07/05/23 1930 07/05/23 1952  BP: 105/79   Pulse: 87   Resp: 14   Temp:  37.2 C  SpO2: 97%     Last Pain:  Vitals:   07/05/23 1952  TempSrc: Oral  PainSc:                  Mayukha Symmonds

## 2023-07-05 NOTE — Lactation Note (Signed)
This note was copied from a baby's chart. Lactation Consultation Note  Patient Name: Kelly Lopez Date: 07/05/2023 Age:40 hours  LC spoke w/dad earlier asking him how mom is doing and her plan to BF. Dad stated she does plan to BF but she is in no shape right now to pump or anything. She needs to recover before she does anything like that. Asked dad to let staff know when mom is ready. Dad stated he was probably going home w/baby tomorrow but mom will need to stay in the hospital for a good while longer.  He said we can check on her in a couple of days to see if she is ready to pump then, but she is no where ready right now.    Maternal Data    Feeding    LATCH Score                    Lactation Tools Discussed/Used    Interventions    Discharge    Consult Status      Kelly Lopez 07/05/2023, 11:14 PM

## 2023-07-05 NOTE — Op Note (Signed)
Kelly Lopez PROCEDURE DATE: 07/05/2023  PREOPERATIVE DIAGNOSES: Intrauterine pregnancy at [redacted]w[redacted]d weeks gestation; failure to progress: arrest of descent, oliguria, acute kidney injury, desires sterilization  POSTOPERATIVE DIAGNOSES: The same, postpartum hemorrhage  PROCEDURE: Repeat Low Transverse Cesarean Section  SURGEON:  Dr. Mariel Aloe  ASSISTANT:  [name], MD  ANESTHESIOLOGY TEAM: Anesthesiologist: Bethena Midget, MD; Nance Pew Nelle Don, DO; Oradell Nation, MD CRNA: Graciela Husbands, CRNA  INDICATIONS: Kelly Lopez is a 42 y.o. [phone] at [redacted]w[redacted]d here for cesarean section secondary to the indications listed under preoperative diagnoses; please see preoperative note for further details.  The risks of surgery were discussed with the patient including but were not limited to: bleeding which may require transfusion or reoperation; infection which may require antibiotics; injury to bowel, bladder, ureters or other surrounding organs; injury to the fetus; need for additional procedures including hysterectomy in the event of a life-threatening hemorrhage; formation of adhesions; placental abnormalities wth subsequent pregnancies; incisional problems; thromboembolic phenomenon and other postoperative/anesthesia complications.  The patient concurred with the proposed plan, giving informed written consent for the procedure.    FINDINGS:  Viable female infant in cephalic presentation.  Apgars 4 and 7.  Meconium stained amniotic fluid.  Intact placenta, three vessel cord.  Normal uterus, fallopian tubes and ovaries bilaterally.  ANESTHESIA: Epidural  INTRAVENOUS FLUIDS: 1400 ml  + 500 mL albumin ESTIMATED BLOOD LOSS: 1594 ml URINE OUTPUT:  100 ml SPECIMENS: Placenta sent to L&D COMPLICATIONS: None immediate  PROCEDURE IN DETAIL:  The patient preoperatively received intravenous antibiotics and TXA and had sequential compression devices applied to her lower extremities.  She was then  taken to the operating room where the epidural anesthesia was dosed up to surgical level and was found to be adequate. She was then placed in a dorsal supine position with a leftward tilt, and prepped and draped in a sterile manner.  A foley catheter was placed into her bladder and attached to constant gravity.  After an adequate timeout was performed, a Pfannenstiel skin incision was made with scalpel two fingerbreaths above the pubic symphysis and carried through to the underlying layer of fascia. The fascia was incised in the midline, and this incision was extended bilaterally using the Mayo scissors.  Kocher clamps x 2 were applied to the superior aspect of the fascial incision and the underlying rectus muscles were dissected off bluntly and sharply.  A similar process was carried out on the inferior aspect of the fascial incision. The rectus muscles were separated in the midline and the peritoneum was entered bluntly. The Alexis self-retaining retractor was introduced into the abdominal cavity.  The vesicouterine peritoneum was identified and grasped using smooth pickups.  It was incised and extended laterally using the Metzenbaum scissors.  A bladder flap was then digitally created.  Attention was turned to the lower uterine segment where a low transverse hysterotomy was made with a scalpel and extended bilaterally bluntly.  The infant was noted to be direct occiput posterior and asynclitic. The infant was successfully delivered, the cord was clamped and cut after one minute, and the infant was handed over to the awaiting neonatology team. Uterine massage was then administered, and the placenta was manually extracted with a three-vessel cord. The uterus was then cleared of clots and debris using manual curettage.  The uterine incision was closed with 0 Monocryl in a running locked fashion. During closure of hysterotomy, a vessel on the left superior aspect noted to be bleeding significantly so four  figure of  eight stitches were placed with adequate hemostasis of the vessel. The remainder of the hysterotomy was subsequently closed. Two additional figure of eight stitches were placed to help with hemostasis.   Attention was then turned to the patient's adnexa. The right fallopian tube was identified and followed out to the fimbriated end.  Ligasure device was used to cauterize and cut the mesosalpinx to proximal end of the fallopian tube, removing 6cm of tube. A similar process was carried out on the left side allowing for bilateral tubal sterilization.    The pelvis was cleared of all clot and debris with irrigation and suction. Hemostasis was confirmed on all surfaces.  Arista powder was placed over the hysterotomy. The retractor was removed.  The peritoneum was closed with a 2-0 Vicryl running stitch. The fascia was then closed using 0 Vicryl in a running fashion.  The subcutaneous layer was irrigated, reapproximated with 0 plain gut in a running fashion, and the skin was closed with a 4-0 Vicryl subcuticular stitch. The patient tolerated the procedure well. Sponge, instrument and needle counts were correct x 3.  She was taken to the recovery room in stable condition.    [name], MD OB Fellow, Faculty Practice [facility], Center for [facility]

## 2023-07-05 NOTE — Consult Note (Signed)
NAME:  Kelly Lopez, MRN:  [id number], DOB:  1981/04/28, LOS: 1 ADMISSION DATE:  07/04/2023 CONSULTATION DATE:  07/05/2023 REFERRING MD:  Donavan Foil - OB CHIEF COMPLAINT:  Hemorrhagic shock post-C-section   History of Present Illness:  42 year old woman who presented to [facility] 10/14 for induction of labor in the setting of advanced maternal age. [redacted] weeks GA. [id number]. PMHx significant for esophageal dysphagia, HSV-1, psoriasis, anxiety/depression.  Presented initially for induction of labor at [redacted] weeks GA in the setting of advanced maternal age. Unfortunately, had failure to progress/arrest of descent prompting Cesarean section. Taken to OR for C-section 10/15.  Intraoperative course was notable for bleeding vessel on the left superior aspect during hysterotomy; a total of 6 figure of eight stitches were placed with adequate hemostasis. Required JADA balloon placement and has required multiple blood product administrations (PRBCs, FFP) as well as vasopressor support. Given ongoing clinical instability, IR was consulted and patient was taken for pelvic angiogram. Underwent nonselective L and R internal iliac artery embolization with IR.  PCCM consulted for ICU admission post-IR.  Pertinent Medical History:   Past Medical History:  Diagnosis Date   Anxiety    Depression    Esophageal dysphagia    History of retained placenta 08/31/2019   Psoriasis    SVD (spontaneous vaginal delivery) 03/01/2022    Significant Hospital Events: Including procedures, antibiotic start and stop dates in addition to other pertinent events   10/14 - Admitted for IOL in AMA patient, [redacted]wk GA. 10/15 - C-section for failure to progress/arrested descent. Intra-op with bleeding vessel, got hemostasis intra-op. Hypotensive in recovery, per OB copious clot removed. JADA device placed. Concern for DIC. Taken to IR for embolization; underwent nonselective L and R internal iliac artery embolization.  Interim History /  Subjective:  PCCM consulted for ICU admission and transfer from [facility].  Objective:  Blood pressure 90/65, pulse (!) 113, temperature 98.2 F (36.8 C), temperature source Oral, resp. rate 17, height 5\' 3"  (1.6 m), weight 100.7 kg, last menstrual period 10/04/2022, SpO2 100%, unknown if currently breastfeeding.        Intake/Output Summary (Last 24 hours) at 07/05/2023 1432 Last data filed at 07/05/2023 1345 Gross per 24 hour  Intake 5500.65 ml  Output 5594 ml  Net -93.35 ml   Filed Weights   07/04/23 0030  Weight: 100.7 kg   Physical Examination: General: Acutely ill-appearing middle-aged woman in NAD. Pleasant and conversant. HEENT: [place]/AT, anicteric sclera, PERRL, dry mucous membranes. Neuro: Awake, oriented x 4. Responds to verbal stimuli. Following commands consistently. Moves all 4 extremities spontaneously. CV: RRR, no m/g/r. PULM: Breathing even and unlabored on 2LNC. Lung fields CTAB. GI: Soft, mildly distended, appropriately TTP postoperatively. Normoactive bowel sounds. GU: JADA device in place with dark red blood in suction tubing. Extremities: Bilateral symmetric 1-2+ LE edema noted. DP/PT pulses able to be dopplered bilaterally. R femoral CVC. Skin: Warm/dry, no rashes.  Resolved Hospital Problem List:    Assessment & Plan:  Hemorrhagic shock with postpartum hemorrhage s/p C-section S/p embolization (nonselective) of L and R internal iliac artery embolization Failure to progress/arrest of descent prompting C-section. Intra-op course notable for bleeding vessel on the left superior aspect during hysterotomy; a total of 6 figure of eight stitches were placed with adequate hemostasis. In PACU, hypotensive with post-partum hemorrhage. S/p manual clot removal via uterine sweep, JADA device insertion, IM methergine and rectal Cytotec. - Admit to ICU - S/p IR embolization of (nonselective) of L and R internal iliac artery -  Goal MAP > 65 - Fluid/blood product resuscitation  as tolerated - Neo titrated to goal MAP - Pulse checks of BLE, s/p embolization  DIC ABLA secondary to postpartum hemorrhage S/p 8U PRBCs, 2U FFP, 1U Plt, 1U Cryo intra-op. 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 - Compliant with acyclovir  Best Practice: (right click and "Reselect all SmartList Selections" daily)   Diet/type: Regular consistency (see orders) DVT prophylaxis: SCDs, hold AC/ppx GI prophylaxis: N/A Lines: Central line R femoral CVC Foley:  Yes, and it is still needed Code Status:  full code Last date of multidisciplinary goals of care discussion [Per Primary Team]  Labs:  CBC: Recent Labs  Lab 07/04/23 0043 07/05/23 0913 07/05/23 1032 07/05/23 1145 07/05/23 1158 07/05/23 1338  WBC 12.0* 16.9*  --   --   --   --   HGB 12.5 12.6  --  7.1*  --  8.2*  HCT 35.9* 36.8  --  21.0*  --  23.8*  MCV 87.3 89.1  --   --   --   --   PLT 250 220 206  --  209 160   Basic Metabolic Panel: Recent Labs  Lab 07/05/23 0236 07/05/23 1145  NA 134* 136  K 3.8 4.8  CL 103  --   CO2 19*  --   GLUCOSE 94  --   BUN 12  --   CREATININE 1.60*  --   CALCIUM 8.2*  --    GFR: Estimated Creatinine Clearance: 52.9 mL/min (A) (by C-G formula based on SCr of 1.6 mg/dL (H)). Recent Labs  Lab 07/04/23 0043 07/05/23 0913  WBC 12.0* 16.9*   Liver Function Tests: No results for input(s): "AST", "ALT", "ALKPHOS", "BILITOT", "PROT", "ALBUMIN" in the last 168 hours. No results for input(s): "LIPASE", "AMYLASE" in the last 168 hours. No results for input(s): "AMMONIA" in  the last 168 hours.  ABG:    Component Value Date/Time   HCO3 19.0 (L) 07/05/2023 1145   TCO2 20 (L) 07/05/2023 1145   ACIDBASEDEF 7.0 (H) 07/05/2023 1145   O2SAT 28 07/05/2023 1145    Coagulation Profile: Recent Labs  Lab 07/05/23 1032 07/05/23 1158 07/05/23 1338  INR 1.1 1.4* PENDING   Cardiac Enzymes: No results for input(s): "CKTOTAL", "CKMB", "CKMBINDEX", "TROPONINI" in the last 168 hours.  HbA1C: Hgb A1c MFr Bld  Date/Time Value Ref Range Status  06/12/2020 03:26 PM 5.0 4.8 - 5.6 % Final    Comment:             Prediabetes: 5.7 - 6.4          Diabetes: >6.4          Glycemic control for adults with diabetes: <7.0   08/07/2019 10:00 AM 4.6 (L) 4.8 - 5.6 % Final    Comment:             Prediabetes: 5.7 - 6.4          Diabetes: >6.4          Glycemic control for adults with diabetes: <7.0    CBG:  No results for input(s): "GLUCAP" in the last 168 hours.  Review of Systems:   Review of systems completed with pertinent positives/negatives outlined in above HPI.  Past Medical History:  She,  has a past medical history of Anxiety, Depression, Esophageal dysphagia, History of retained placenta (08/31/2019), Psoriasis, and SVD (spontaneous vaginal delivery) (03/01/2022).   Surgical History:   Past Surgical History:  Procedure Laterality Date   DILATION AND EVACUATION N/A 08/31/2019   Procedure: DILATATION AND EVACUATION;  Surgeon: Tereso Newcomer, MD;  Location: [facility] LD ORS;  Service: Gynecology;  Laterality: N/A;   NO PAST SURGERIES      Social History:   reports that she has quit smoking. Her smoking use included cigars. She has never used smokeless tobacco. She reports that she does not currently use alcohol. She reports that she does not currently use drugs.   Family History:  Her family history includes Cancer in her maternal grandmother and paternal grandmother; Heart disease in her maternal grandfather and paternal grandfather.   Allergies: Allergies   Allergen Reactions   Sertraline Hcl Other (See Comments)   Ferrous Sulfate Rash   Home Medications: Prior to Admission medications   Medication Sig Start Date End Date Taking? Authorizing Provider  aspirin EC 81 MG tablet Take 1 tablet (81 mg total) by mouth daily. Swallow whole. 02/10/23  Yes Venora Maples, MD  Docusate Sodium (DSS) 100 MG CAPS Take 2 capsules by mouth daily.   Yes [provider]  omeprazole (PRILOSEC) 20 MG capsule Take 20 mg by mouth daily.   Yes [provider]  prenatal vitamin w/FE, FA (PRENATAL 1 + 1) 27-1 MG TABS tablet Take 1 tablet by mouth daily at 12 noon.   Yes [provider]  valACYclovir (VALTREX) 500 MG tablet Take 1 tablet (500 mg total) by mouth daily. 03/10/23  Yes Sue Lush, FNP  venlafaxine XR (EFFEXOR-XR) 75 MG 24 hr capsule Take 75 mg by mouth every morning.   Yes [provider]    Critical care time:   The patient is critically ill with multiple organ system failure and requires high complexity decision making for assessment and support, frequent evaluation and titration of therapies, advanced monitoring, review of radiographic studies and interpretation of complex data.   Critical Care Time devoted to patient care services, exclusive of separately billable procedures, described in this note is 41 minutes.  Tim Lair, PA-C [facility] 07/05/23 2:32 PM  Please see [email or web address] for pager details.  From 7A-7P if no response, please call [phone] After hours, please call ELink [phone]

## 2023-07-05 NOTE — Progress Notes (Signed)
Labor Progress Note Kelly Lopez is a 42 y.o. G6P0140 at [redacted]w[redacted]d presented for IOL 2/2 AMA.  S: Pt concerned about maternal/fetal wellbeing, family at bedside. She is ready for delivery at this time. No other concerns.  O:  BP 114/69   Pulse (!) 104   Temp 98.6 F (37 C) (Oral)   Resp 18   Ht 5\' 3"  (1.6 m)   Wt 100.7 kg   LMP 10/04/2022   SpO2 95%   BMI 39.31 kg/m  EFM: 130/mod/+a/-d Toco: q2-3  CVE: Dilation: Lip/rim Dilation Complete Date: 07/05/23 Dilation Complete Time: 0450 Effacement (%): 90 Cervical Position: Posterior Station: -1, 0 Presentation: Vertex Exam by:: Dr. Donavan Foil   [name]: 42 y.o. [id number] [redacted]w[redacted]d presenting for IOL 2/2 AMA, labor c/b AKI with last Cr 1.6.  #Labor: Discussed in detail with patient that at this juncture that with creatinine rising and arrest of station, there may be worsening of renal function prior to delivery. Given the rise in creatinine, would recommend proceeding with cesarean delivery.   Kelly Lopez has agreed to proceed with cesarean section due to Arrest of Descent and Acute Kidney Injury . The risks of surgery were discussed with the patient including but were not limited to: bleeding which may require transfusion or reoperation; infection which may require antibiotics; injury to bowel, bladder, ureters or other surrounding organs; injury to the fetus; need for additional procedures including hysterectomy in the event of a life-threatening hemorrhage; formation of adhesions; placental abnormalities wth subsequent pregnancies; incisional problems; thromboembolic phenomenon and other postoperative/anesthesia complications. The patient concurred with the proposed plan, giving informed written consent for the procedure. Patient has been on CLD greater than 24 hours. Anesthesia and OR aware. Preoperative prophylactic antibiotics and SCDs ordered on call to the OR. To OR when ready.   Patient desires permanent sterilization.  Other reversible  forms of contraception were discussed with patient; she declines all other modalities. Risks of procedure discussed with patient including but not limited to: risk of regret, permanence of method, bleeding, infection, injury to surrounding organs and need for additional procedures.  Failure risk of about 1% with increased risk of ectopic gestation if pregnancy occurs was also discussed with patient.  Also discussed possibility of post-tubal pain syndrome. Patient verbalized understanding of these risks and wants to proceed with sterilization.  Written informed consent obtained.  To OR when ready.   #Pain: Epidural #FWB: Cat I-II #GBS positive  #AKI: suspect postrenal AKI, will recheck labs 4 hours postop   Patient seen with Dr. Donavan Foil.   [name], MD OB Fellow, Faculty [facility], [facility]  07/05/23 8:55 AM

## 2023-07-05 NOTE — Transfer of Care (Addendum)
Immediate Anesthesia Transfer of Care Note  Patient: Kelly Lopez  Procedure(s) Performed: CESAREAN SECTION BILATERAL TUBAL LIGATION (Bilateral: Abdomen)  Patient Location: PACU  Anesthesia Type:Epidural and MAC combined with regional for post-op pain  Level of Consciousness: awake, alert , and oriented  Airway & Oxygen Therapy: Patient Spontanous Breathing  Post-op Assessment: Report given to RN and Post -op Vital signs reviewed and stable  Post vital signs: Reviewed and stable, Dr. Miguel Rota at bedside.  Last Vitals:  Vitals Value Taken Time  BP 133/114 07/05/23 1200  Temp 37.6 C 07/05/23 1130  Pulse 84 07/05/23 1203  Resp 16 07/05/23 1203  SpO2 100 % 07/05/23 1203  Vitals shown include unfiled device data.  Last Pain:  Vitals:   07/05/23 1500  TempSrc:   PainSc: 7          Complications: No notable events documented.

## 2023-07-05 NOTE — Discharge Summary (Signed)
Postpartum Discharge Summary  Date of Service updated 07/09/2023      Patient Name: Kelly Lopez DOB: [birth year] MRN: [id number]  Date of admission: 07/04/2023 Delivery date:07/05/2023 Delivering provider: Warden Fillers Date of discharge: 07/09/2023  Admitting diagnosis: AMA (advanced maternal age) multigravida 35+, third trimester [O09.523] S/P cesarean section [Z98.891] Intrauterine pregnancy: [redacted]w[redacted]d     Secondary diagnosis:  Principal Problem:   Cesarean delivery delivered Active Problems:   HSV-1 (herpes simplex virus 1) infection   Major depressive disorder, recurrent, in partial remission (HCC)   Alpha-1-antitrypsin deficiency carrier   GBS (group B Streptococcus carrier), +RV culture, currently pregnant   AMA (advanced maternal age) multigravida 35+, third trimester   Status post bilateral salpingectomy   S/P cesarean section  Additional problems: Cesarean delivery delivered - Plan: Discharge patient  Supervision of high risk pregnancy, antepartum - Plan: Type and screen, RPR, Type and screen, RPR, CANCELED: CBC, CANCELED: CBC  S/P cesarean section - Plan: Discharge patient     Discharge diagnosis: Term Pregnancy Delivered, Anemia, and PPH                                              Post partum procedures:blood transfusion and   Augmentation: AROM, Pitocin, and IP Foley Complications: Hemorrhage>1063mL  Hospital course: Induction of Labor With Cesarean Section   42 y.o. yo [phone] at [redacted]w[redacted]d was admitted to the hospital 07/04/2023 for induction of labor. Patient had a labor course significant for development of AKI during labor, thought to be post-obstructive. Patient dilated to 9.5cm, but despite adequate maternal effort with pushing, she had arrest of descent. The patient went for cesarean section due to Arrest of Descent and acute kidney injury . Delivery details are as follows: Membrane Rupture Time/Date: 9:21 AM,07/04/2023  Delivery Method:C-Section,  Low Transverse Operative Delivery:N/A Details of operation can be found in separate operative Note.  Patient had a postpartum course complicated by anemia and she received transfusion of RBC. Dr. Donavan Foil documented as follows  SA severe postpartum hemorrhage had started and she was being assessed. The team was already about to call me when I arrived. There appeared to be a constant trickle from the vagina when I saw the patient. Uterine fundus felt somewhat boggy and decision was made for JADA device insertion. I donned sterile gloves and removed a significant amount of clot during my uterine sweep. I could feel more clot anterior but could not reach it to dislodge it due to the size of my hands. I called my partner Dr. Charlotta Newton and she was able to remove the residual clot and place the JADA device. Pt was simultaneously receiving another dose of IM methergine and rectal cytotec, 1000 mcg. DIC panel was ordered at this time. The JADA device was activated at around 1210 and the uterus did firm up, but a significant amount of blood was accumulating in the canister which is not the norm. At this point paint had received 2 units of blood and was starting a third. Pt was on pressors to maintain BP. Due to the continued bleeding, a unit of FFP was given to address a developing coagulopathy. Due to continued bleeding, interventional radiology was called to assess the patient. Pt was seen by IR by 1250 and in the Interventional radiology suite by 110. While in the IR suite the patient received another 2 units  of blood and a unit cryoprecipitate and another unit of FFP. See procedure note from interventional radiology. The uterus was successfully embolized. At this point the patient had received 8 units of blood, 2 units of FFP, and 1 unit of cryoprecipitate. Once stable, the patient was moved to the Main OR PACU pending a bed in the ICU. After transfer from ICU to [facility] she did well but received another unit of RBC on 10/18 for  sx with a hemoglobin of 7. Post transfusion at discharge he hemoglobin was 7.5 and she felt better She is ambulating, tolerating a regular diet, passing flatus, and urinating well.  Patient is discharged home in stable condition on 07/09/2023   Newborn Data: Birth date:07/05/2023 Birth time:9:56 AM Gender:Female Living status:Living Apgars:4 ,7  Weight:3350 g                               Magnesium Sulfate received: No BMZ received: No Rhophylac:N/A MMR:N/A T-DaP:Given prenatally Flu: No RSV Vaccine received: No Transfusion:Yes  Immunizations received: Immunization History  Administered Date(s) Administered   HPV Quadrivalent 04/16/2010   Influenza Inj Mdck Quad Pf 06/27/2015   Influenza, Seasonal, Injecte, Preservative Fre 06/28/2016, 06/28/2017, 06/21/2018, 07/04/2019, 06/14/2023   Influenza,inj,Quad PF,6+ Mos 06/08/2013, 06/27/2015   Influenza-Unspecified 07/19/2020, 06/23/2021, 07/06/2022   PFIZER(Purple Top)SARS-COV-2 Vaccination 11/13/2019, 12/04/2019   PPD Test 02/04/2016   Rsv, Bivalent, Protein Subunit Rsvpref,pf Verdis Frederickson) 06/14/2023   Tdap 12/30/2017, 05/05/2023    Physical exam  Vitals:   07/09/23 0719 07/09/23 0724 07/09/23 0729 07/09/23 0734  BP:    127/76  Pulse:    85  Resp:    16  Temp:    98 F (36.7 C)  TempSrc:    Oral  SpO2: 99% 99% 99% 99%  Weight:      Height:       General: alert, cooperative, and no distress Lochia: appropriate Uterine Fundus: firm Incision: Healing well with no significant drainage, Dressing is clean, dry, and intact DVT Evaluation: No evidence of DVT seen on physical exam. Labs: Lab Results  Component Value Date   WBC 7.3 07/09/2023   HGB 7.5 (L) 07/09/2023   HCT 21.6 (L) 07/09/2023   MCV 87.8 07/09/2023   PLT 154 07/09/2023      Latest Ref Rng & Units 07/06/2023    8:03 PM  CMP  Glucose 70 - 99 mg/dL 95   BUN 6 - 20 mg/dL 10   Creatinine 1.61 - 1.00 mg/dL 0.96   Sodium 045 - 409 mmol/L 134   Potassium 3.5  - 5.1 mmol/L 4.3   Chloride 98 - 111 mmol/L 104   CO2 22 - 32 mmol/L 23   Calcium 8.9 - 10.3 mg/dL 8.3    Edinburgh Score:    07/08/2023    5:02 AM  Edinburgh Postnatal Depression Scale Screening Tool  I have been able to laugh and see the funny side of things. 0  I have looked forward with enjoyment to things. 0  I have blamed myself unnecessarily when things went wrong. 1  I have been anxious or worried for no good reason. 2  I have felt scared or panicky for no good reason. 1  Things have been getting on top of me. 1  I have been so unhappy that I have had difficulty sleeping. 1  I have felt sad or miserable. 0  I have been so unhappy that I have been crying. 0  The thought of harming myself has occurred to me. 0  Edinburgh Postnatal Depression Scale Total 6   Edinburgh Postnatal Depression Scale Total: 6   After visit meds:  Allergies as of 07/09/2023       Reactions   Sertraline Hcl Other (See Comments)   Ferrous Sulfate Rash        Medication List     STOP taking these medications    aspirin EC 81 MG tablet   DSS 100 MG Caps   omeprazole 20 MG capsule Commonly known as: PRILOSEC   prenatal vitamin w/FE, FA 27-1 MG Tabs tablet   valACYclovir 500 MG tablet Commonly known as: VALTREX   venlafaxine XR 75 MG 24 hr capsule Commonly known as: EFFEXOR-XR       TAKE these medications    Ferric Maltol 30 MG Caps Take 1 capsule (30 mg total) by mouth 2 (two) times daily. Please take one hour before breakfast and dinner   oxyCODONE 5 MG immediate release tablet Commonly known as: Oxy IR/ROXICODONE Take 1-2 tablets (5-10 mg total) by mouth every 4 (four) hours as needed for moderate pain (pain score 4-6).         Discharge home in stable condition Infant Feeding: Breast Infant Disposition:home with mother Discharge instruction: per After Visit Summary and Postpartum booklet. Activity: Advance as tolerated. Pelvic rest for 6 weeks.  Diet: routine  diet Future Appointments: Future Appointments  Date Time Provider Department Center  08/15/2023  1:35 PM Adam Phenix, MD [facility]   Follow up Visit:  Follow-up Information     [facility] Follow up in 5 day(s).   Specialty: Obstetrics and Gynecology Why: For wound re-check Contact information: [address] [place] [phone]               Message sent to [facility] 10/15  Please schedule this patient for a In person postpartum visit in 4 weeks with the following provider: Any provider. Additional Postpartum F/U:Incision check 1 week  High risk pregnancy complicated by: AMA Delivery mode:  C-Section, Low Transverse Anticipated Birth Control:  BTL done [facility]   07/09/2023 Scheryl Darter, MD

## 2023-07-05 NOTE — Consult Note (Signed)
Chief Complaint: Patient was seen in consultation today for post partum hemorrhage.  Referring Physician(s): Mariel Aloe, MD  Supervising Physician: Gilmer Mor  Patient Status: [facility] - In-pt  History of Present Illness: Kelly Lopez is a 42 y.o. female with a past medical history significant for anxiety, depression who presented to [facility] 07/04/23 for induction of labor due to advanced maternal age. She subsequently required a repeat low transverse cesarean section due to arrest of descent, oliguria and AKI which was performed this afternoon. She also underwent elective sterilization during the procedure. Post procedure patient continued to have prolonged vaginal bleeding and JADA balloon was placed. She has required multiple units of pRBC, FFP and pressor support. IR has been consulted for pelvic angiogram with possible embolization.  Patient seen in OB post op area, she is awake, alert, conversant. Procedure discussed with patient at bedside who states understanding and gives verbal consent to proceed. Procedure additionally discussed with patient's mother who is also in agreement to proceed.  Past Medical History:  Diagnosis Date   Anxiety    Depression    Esophageal dysphagia    History of retained placenta 08/31/2019   Psoriasis    SVD (spontaneous vaginal delivery) 03/01/2022    Past Surgical History:  Procedure Laterality Date   DILATION AND EVACUATION N/A 08/31/2019   Procedure: DILATATION AND EVACUATION;  Surgeon: Tereso Newcomer, MD;  Location: [facility] LD ORS;  Service: Gynecology;  Laterality: N/A;   NO PAST SURGERIES      Allergies: Sertraline hcl and Ferrous sulfate  Medications: Prior to Admission medications   Medication Sig Start Date End Date Taking? Authorizing Provider  aspirin EC 81 MG tablet Take 1 tablet (81 mg total) by mouth daily. Swallow whole. 02/10/23  Yes Venora Maples, MD  Docusate Sodium (DSS) 100 MG CAPS Take 2 capsules by mouth  daily.   Yes [provider]  omeprazole (PRILOSEC) 20 MG capsule Take 20 mg by mouth daily.   Yes [provider]  prenatal vitamin w/FE, FA (PRENATAL 1 + 1) 27-1 MG TABS tablet Take 1 tablet by mouth daily at 12 noon.   Yes [provider]  valACYclovir (VALTREX) 500 MG tablet Take 1 tablet (500 mg total) by mouth daily. 03/10/23  Yes Sue Lush, FNP  venlafaxine XR (EFFEXOR-XR) 75 MG 24 hr capsule Take 75 mg by mouth every morning.   Yes [provider]     Family History  Problem Relation Age of Onset   Cancer Maternal Grandmother    Heart disease Maternal Grandfather    Cancer Paternal Grandmother    Heart disease Paternal Grandfather     Social History   Socioeconomic History   Marital status: Single    Spouse name: Not on file   Number of children: Not on file   Years of education: Not on file   Highest education level: Associate degree: occupational, Scientist, product/process development, or vocational program  Occupational History   Not on file  Tobacco Use   Smoking status: Former    Types: Cigars   Smokeless tobacco: Never  Vaping Use   Vaping status: Never Used  Substance and Sexual Activity   Alcohol use: Not Currently   Drug use: Not Currently   Sexual activity: Yes    Birth control/protection: None  Other Topics Concern   Not on file  Social History Narrative   Not on file   Social Determinants of Health   Financial Resource Strain: Low Risk  (  03/10/2023)   Overall Financial Resource Strain (CARDIA)    Difficulty of Paying Living Expenses: Not very hard  Food Insecurity: No Food Insecurity (07/04/2023)   Hunger Vital Sign    Worried About Running Out of Food in the Last Year: Never true    Ran Out of Food in the Last Year: Never true  Transportation Needs: No Transportation Needs (07/04/2023)   PRAPARE - Administrator, Civil Service (Medical): No    Lack of Transportation (Non-Medical): No  Physical Activity: Unknown  (03/10/2023)   Exercise Vital Sign    Days of Exercise per Week: 0 days    Minutes of Exercise per Session: Not on file  Stress: Stress Concern Present (03/10/2023)   [employer] of Occupational Health - Occupational Stress Questionnaire    Feeling of Stress : To some extent  Social Connections: Socially Isolated (03/10/2023)   Social Connection and Isolation Panel [NHANES]    Frequency of Communication with Friends and Family: More than three times a week    Frequency of Social Gatherings with Friends and Family: Once a week    Attends Religious Services: Never    Database administrator or Organizations: No    Attends Engineer, structural: Not on file    Marital Status: Never married     Review of Systems: A 12 point ROS discussed and pertinent positives are indicated in the HPI above.  All other systems are negative.  Review of Systems  Unable to perform ROS: Acuity of condition    Vital Signs: BP 105/68   Pulse 97   Temp 98.2 F (36.8 C) (Oral)   Resp 17   Ht 5\' 3"  (1.6 m)   Wt 221 lb 14.4 oz (100.7 kg)   LMP 10/04/2022   SpO2 99%   Breastfeeding Unknown   BMI 39.31 kg/m   Physical Exam Vitals and nursing note reviewed.  Constitutional:      Comments: Laying prone in bed, multiple staff members and mother at bedside. Receiving FFP + pRBCs, JADA balloon in place, bright red blood in canister and noted on bed linens. SCDs in place.  HENT:     Head: Normocephalic.  Cardiovascular:     Rate and Rhythm: Normal rate.  Pulmonary:     Effort: Pulmonary effort is normal.  Abdominal:     Comments: Recent c-section  Skin:    General: Skin is warm and dry.  Neurological:     Mental Status: She is alert and oriented to person, place, and time.      MD Evaluation Airway: WNL Heart: WNL Abdomen: Other (comments) Abdomen comments: Post partum; ongoing bleeding. JADA balloon in place Chest/ Lungs: WNL ASA  Classification: 3 Mallampati/Airway Score:  One   Imaging: [place] MFM OB FOLLOW UP  Result Date: 06/14/2023 ----------------------------------------------------------------------  OBSTETRICS REPORT                       (Signed Final 06/14/2023 10:44 am) ---------------------------------------------------------------------- Patient Info  ID #:       [id number]                          D.O.B.:  1981/06/29 (41 yrs)  Name:       Kelly Lopez               Visit Date: 06/14/2023 09:42 am ---------------------------------------------------------------------- Performed By  Attending:  Burk Schaible DO       Ref. Address:     [address]  Performed By:     Eden Lathe BS      [facility]  Referred By:      Venora Maples MD ---------------------------------------------------------------------- Orders  #  Description                           Code        Ordered By  1  [place] MFM OB FOLLOW UP                   76816.01    RAVI SHANKAR  2  [place] MFM FETAL BPP WO NON               76819.01    RAVI [facility]     STRESS ----------------------------------------------------------------------  #  Order #                     Accession #                Episode #  1  [id number]                   [id number]                 [id number]  2  [id number]                   [id number]                 [id number] ---------------------------------------------------------------------- Indications  Advanced maternal age multigravida 94+,        O38.523  third trimester (42 yo)  Obesity complicating pregnancy, third          O99.213  trimester (BMI 33)  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 Cord Insertion:      Previously seen  Amniotic Fluid  AFI FV:      Within normal limits  AFI Sum(cm)     %Tile       Largest Pocket(cm)  21.39           81          8.55  RUQ(cm)       RLQ(cm)       LUQ(cm)        LLQ(cm)  3.47          8.55          5.09           4.28 ---------------------------------------------------------------------- Biophysical Evaluation  Amniotic F.V:   Within normal limits       F. Tone:        Observed  F. Movement:    Observed                   Score:          8/8  F. Breathing:   Observed ---------------------------------------------------------------------- Biometry  BPD:      93.2  mm     G. Age:  37w 6d         94  %    CI:        76.58   %    70 - 86                                                          FL/HC:      19.8   %    20.1 - 22.1  HC:      337.4  mm     G. Age:  38w 5d         81  %    HC/AC:      1.05        0.93 - 1.11  AC:      321.7  mm     G. Age:  36w 1d         59  %    FL/BPD:     71.8   %    71 - 87  FL:       66.9  mm     G. Age:  34w 3d         10  %    FL/AC:      20.8   %    20 - 24  Est. FW:    2856  gm      6 lb 5 oz     51  % ---------------------------------------------------------------------- OB History  Blood Type:   A+  Maternal Racial/Ethnic Group:   White  Gravidity:    6         Term:  0        Prem:   1        SAB:   3  TOP:          1       Ectopic:  0        Living: 0 ---------------------------------------------------------------------- Gestational Age  LMP:           36w 1d        Date:  10/04/22                  EDD:    07/11/23  U/S Today:     36w 6d                                        EDD:   07/06/23  Best:          36w 1d     Det. By:  LMP  (10/04/22)          EDD:   07/11/23 ---------------------------------------------------------------------- Anatomy  Cranium:               Appears normal         Aortic Arch:            Appears normal  Cavum:                 Appears normal         Ductal Arch:            Previously seen  Ventricles:            Previously seen        Diaphragm:              Previously seen  Choroid Plexus:        Previously seen        Stomach:                Appears normal, left                                                                        sided  Cerebellum:            Previously seen        Abdomen:                Previously seen  Posterior Fossa:       Previously seen        Abdominal Wall:         Previously seen  Nuchal Fold:           Not applicable (>20    Cord Vessels:           Previously seen                         wks GA)  Face:                  Appears normal         Kidneys:                Appear normal                         (  orbits and profile)  Lips:                  Previously seen        Bladder:                Appears normal  Thoracic:              Previously seen        Spine:                  Previously seen  Heart:                 Appears normal         Upper Extremities:      Prev seen (right                         (4CH, axis, and                                hand limited views)                         situs)  RVOT:                  Appears normal         Lower Extremities:      Previously seen  LVOT:                  Previously seen  Other:  Female gender previously seen. Nasal bone, Lenses, VC, 3VV, 3VTV,          Feet, heels and Lt hand prev visualized. VC prev visualized. ---------------------------------------------------------------------- Cervix Uterus Adnexa  Cervix  Not visualized (advanced GA >24wks)  Uterus  No abnormality visualized.  Right Ovary  Not  visualized.  Left Ovary  Not visualized.  Cul De Sac  No free fluid seen.  Adnexa  No abnormality visualized ---------------------------------------------------------------------- Comments  The patient is here for a follow-up BPP and growth ultrasound  at 36w 1d. EDD: 07/11/2023 dated by LMP  (10/04/22). She  has no concerns today and reports normal fetal movement.  Sonographic findings  Single intrauterine pregnancy.  Fetal cardiac activity: Observed.  Presentation: Cephalic.  Interval fetal anatomy appears normal.  Fetal biometry shows the estimated fetal weight at the 51  percentile.  Amniotic fluid volume: Within normal limits. AFI: 21.39 cm.  MVP: 8.55 cm.  Placenta: Posterior.  BPP: 8/8.  Recommendations  1. Weekly antenatal testing until delivery  2. Delivery scheduled for 06/04/23 ----------------------------------------------------------------------                  Braxton Feathers, DO Electronically Signed Final Report   06/14/2023 10:44 am ----------------------------------------------------------------------   [place] MFM FETAL BPP WO NON STRESS  Result Date: 06/14/2023 ----------------------------------------------------------------------  OBSTETRICS REPORT                       (Signed Final 06/14/2023 10:44 am) ---------------------------------------------------------------------- Patient Info  ID #:       [id number]                          D.O.B.:  [birth year] (41 yrs)  Name:       Kelly Lopez  Visit Date: 06/14/2023 09:42 am ---------------------------------------------------------------------- Performed By  Attending:        Braxton Feathers DO       Ref. Address:     [address]  Performed By:     Eden Lathe BS      [facility]                                 [facility]  Referred By:       Venora Maples MD ---------------------------------------------------------------------- Orders  #  Description                           Code        Ordered By  1  [place] MFM OB FOLLOW UP                   76816.01    RAVI SHANKAR  2  [place] MFM FETAL BPP WO NON               76819.01    RAVI [facility]     STRESS ----------------------------------------------------------------------  #  Order #                     Accession #                Episode #  1  [id number]                   [id number]                 [id number]  2  [id number]                   [id number]                 [id number] ---------------------------------------------------------------------- Indications  Advanced maternal age multigravida 40+,        O45.523  third trimester (42 yo)  Obesity complicating pregnancy, third          O99.213  trimester (BMI 33)  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 Cord Insertion:      Previously seen  Amniotic Fluid  AFI FV:      Within normal limits  AFI Sum(cm)     %Tile       Largest Pocket(cm)  21.39           81          8.55  RUQ(cm)       RLQ(cm)       LUQ(cm)        LLQ(cm)  3.47          8.55          5.09           4.28 ---------------------------------------------------------------------- Biophysical Evaluation  Amniotic F.V:   Within normal limits       F. Tone:        Observed  F. Movement:    Observed                   Score:           8/8  F. Breathing:   Observed ---------------------------------------------------------------------- Biometry  BPD:      93.2  mm     G. Age:  37w 6d         94  %    CI:        76.58   %    70 - 86                                                          FL/HC:      19.8   %    20.1 - 22.1  HC:      337.4  mm     G. Age:  38w 5d         81  %    HC/AC:      1.05        0.93 - 1.11  AC:      321.7  mm     G. Age:  36w 1d         59  %    FL/BPD:     71.8   %    71 - 87  FL:       66.9  mm     G. Age:  34w 3d         10  %    FL/AC:      20.8   %    20 - 24  Est. FW:    2856  gm      6 lb 5 oz     51  % ---------------------------------------------------------------------- OB History  Blood Type:   A+  Maternal Racial/Ethnic Group:  White  Gravidity:    6         Term:   0        Prem:   1        SAB:   3  TOP:          1       Ectopic:  0        Living: 0 ---------------------------------------------------------------------- Gestational Age  LMP:           36w 1d        Date:  10/04/22                  EDD:   07/11/23  U/S Today:     36w 6d                                        EDD:   07/06/23  Best:          36w 1d     Det. By:  LMP  (10/04/22)          EDD:   07/11/23 ---------------------------------------------------------------------- Anatomy  Cranium:               Appears normal         Aortic Arch:            Appears normal  Cavum:                 Appears normal         Ductal Arch:            Previously seen  Ventricles:            Previously seen        Diaphragm:              Previously seen  Choroid Plexus:        Previously seen        Stomach:                Appears normal, left                                                                        sided  Cerebellum:            Previously seen        Abdomen:                Previously seen  Posterior Fossa:       Previously seen        Abdominal Wall:         Previously seen  Nuchal Fold:           Not applicable (>20    Cord Vessels:            Previously seen                         wks GA)  Face:                  Appears normal  Kidneys:                Appear normal                         (orbits and profile)  Lips:                  Previously seen        Bladder:                Appears normal  Thoracic:              Previously seen        Spine:                  Previously seen  Heart:                 Appears normal         Upper Extremities:      Prev seen (right                         (4CH, axis, and                                hand limited views)                         situs)  RVOT:                  Appears normal         Lower Extremities:      Previously seen  LVOT:                  Previously seen  Other:  Female gender previously seen. Nasal bone, Lenses, VC, 3VV, 3VTV,          Feet, heels and Lt hand prev visualized. VC prev visualized. ---------------------------------------------------------------------- Cervix Uterus Adnexa  Cervix  Not visualized (advanced GA >24wks)  Uterus  No abnormality visualized.  Right Ovary  Not visualized.  Left Ovary  Not visualized.  Cul De Sac  No free fluid seen.  Adnexa  No abnormality visualized ---------------------------------------------------------------------- Comments  The patient is here for a follow-up BPP and growth ultrasound  at 36w 1d. EDD: 07/11/2023 dated by LMP  (10/04/22). She  has no concerns today and reports normal fetal movement.  Sonographic findings  Single intrauterine pregnancy.  Fetal cardiac activity: Observed.  Presentation: Cephalic.  Interval fetal anatomy appears normal.  Fetal biometry shows the estimated fetal weight at the 51  percentile.  Amniotic fluid volume: Within normal limits. AFI: 21.39 cm.  MVP: 8.55 cm.  Placenta: Posterior.  BPP: 8/8.  Recommendations  1. Weekly antenatal testing until delivery  2. Delivery scheduled for 06/04/23 ----------------------------------------------------------------------                  Braxton Feathers, DO Electronically Signed  Final Report   06/14/2023 10:44 am ----------------------------------------------------------------------    Labs:  CBC: Recent Labs    04/07/23 0913 07/04/23 0043 07/05/23 0913 07/05/23 1032 07/05/23 1145 07/05/23 1158  WBC 9.3 12.0* 16.9*  --   --   --   HGB 12.5 12.5 12.6  --  7.1*  --   HCT 36.7 35.9* 36.8  --  21.0*  --   PLT 258 250 220 206  --  209    COAGS: Recent Labs    07/05/23 1032 07/05/23 1158  INR 1.1 1.4*  APTT 29 37*    BMP: Recent Labs    07/05/23 0236 07/05/23 1145  NA 134* 136  K 3.8 4.8  CL 103  --   CO2 19*  --   GLUCOSE 94  --   BUN 12  --   CALCIUM 8.2*  --   CREATININE 1.60*  --   GFRNONAA 42*  --     LIVER FUNCTION TESTS: No results for input(s): "BILITOT", "AST", "ALT", "ALKPHOS", "PROT", "ALBUMIN" in the last 8760 hours.  TUMOR MARKERS: No results for input(s): "AFPTM", "CEA", "CA199", "CHROMGRNA" in the last 8760 hours.  Assessment and Plan:  42 y/o F admitted 07/04/23 for induction of labor eventually undergoing repeat transverse c-section this afternoon complicated by post operative hemorrhage not controlled with conservative management. IR has been consulted for emergent pelvic angiogram with possible embolization. Procedure discussed with patient and her mother who are both in agreement to proceed.  Patient to be brought emergently to IR for procedure. IR will follow up tomorrow.  Risks and benefits of pelvic  arteriogram with intervention were discussed with the patient including, but not limited to bleeding, infection, vascular injury, contrast induced renal failure, stroke, reperfusion hemorrhage, or even death.  This interventional procedure involves the use of X-rays and because of the nature of the planned procedure, it is possible that we will have prolonged use of X-ray fluoroscopy. Potential radiation risks to you include (but are not limited to) the following: - A slightly elevated risk for cancer  several years  later in life. This risk is typically less than 0.5% percent. This risk is low in comparison to the normal incidence of human cancer, which is 33% for women and 50% for men according to the American Cancer Society. - Radiation induced injury can include skin redness, resembling a rash, tissue breakdown / ulcers and hair loss (which can be temporary or permanent).  The likelihood of either of these occurring depends on the difficulty of the procedure and whether you are sensitive to radiation due to previous procedures, disease, or genetic conditions.  IF your procedure requires a prolonged use of radiation, you will be notified and given written instructions for further action.  It is your responsibility to monitor the irradiated area for the 2 weeks following the procedure and to notify your physician if you are concerned that you have suffered a radiation induced injury.    All of the patient's questions were answered, patient is agreeable to proceed.  Consent signed and in chart.  Thank you for this interesting consult.  I greatly enjoyed meeting Kelly Lopez and look forward to participating in their care.  A copy of this report was sent to the requesting provider on this date.  Electronically Signed: Villa Herb, PA-C 07/05/2023, 1:05 PM   I spent a total of 55 Miinutes in face to face in clinical consultation, greater than 50% of which was counseling/coordinating care for post partum hemorrhage.

## 2023-07-06 ENCOUNTER — Ambulatory Visit: Payer: No Typology Code available for payment source

## 2023-07-06 ENCOUNTER — Encounter (HOSPITAL_COMMUNITY): Payer: Self-pay | Admitting: Radiology

## 2023-07-06 DIAGNOSIS — Z98891 History of uterine scar from previous surgery: Secondary | ICD-10-CM

## 2023-07-06 DIAGNOSIS — Z3A00 Weeks of gestation of pregnancy not specified: Secondary | ICD-10-CM

## 2023-07-06 LAB — DIC (DISSEMINATED INTRAVASCULAR COAGULATION)PANEL
D-Dimer, Quant: 3.92 ug{FEU}/mL — ABNORMAL HIGH (ref 0.00–0.50)
D-Dimer, Quant: 2.21 ug{FEU}/mL — ABNORMAL HIGH (ref 0.00–0.50)
Fibrinogen: 319 mg/dL (ref 210–475)
Fibrinogen: 413 mg/dL (ref 210–475)
INR: 1.2 (ref 0.8–1.2)
INR: 1.1 (ref 0.8–1.2)
Platelets: 95 10*3/uL — ABNORMAL LOW (ref 150–400)
Platelets: 84 10*3/uL — ABNORMAL LOW (ref 150–400)
Prothrombin Time: 15.2 s (ref 11.4–15.2)
Prothrombin Time: 14.5 s (ref 11.4–15.2)
Smear Review: NONE SEEN
Smear Review: NONE SEEN
aPTT: 28 s (ref 24–36)
aPTT: 29 s (ref 24–36)

## 2023-07-06 LAB — PREPARE PLATELET PHERESIS
Unit division: 0
Unit division: 0

## 2023-07-06 LAB — MASSIVE TRANSFUSION PROTOCOL ORDER (BLOOD BANK NOTIFICATION)

## 2023-07-06 LAB — PHOSPHORUS: Phosphorus: 5 mg/dL — ABNORMAL HIGH (ref 2.5–4.6)

## 2023-07-06 LAB — CBC
HCT: 25.5 % — ABNORMAL LOW (ref 36.0–46.0)
HCT: 23.3 % — ABNORMAL LOW (ref 36.0–46.0)
HCT: 24.8 % — ABNORMAL LOW (ref 36.0–46.0)
Hemoglobin: 9.1 g/dL — ABNORMAL LOW (ref 12.0–15.0)
Hemoglobin: 8.2 g/dL — ABNORMAL LOW (ref 12.0–15.0)
Hemoglobin: 8.7 g/dL — ABNORMAL LOW (ref 12.0–15.0)
MCH: 30.6 pg (ref 26.0–34.0)
MCH: 31.2 pg (ref 26.0–34.0)
MCH: 30.5 pg (ref 26.0–34.0)
MCHC: 35.7 g/dL (ref 30.0–36.0)
MCHC: 35.2 g/dL (ref 30.0–36.0)
MCHC: 35.1 g/dL (ref 30.0–36.0)
MCV: 85.9 fL (ref 80.0–100.0)
MCV: 88.6 fL (ref 80.0–100.0)
MCV: 87 fL (ref 80.0–100.0)
Platelets: 94 10*3/uL — ABNORMAL LOW (ref 150–400)
Platelets: 82 10*3/uL — ABNORMAL LOW (ref 150–400)
Platelets: 108 10*3/uL — ABNORMAL LOW (ref 150–400)
RBC: 2.97 MIL/uL — ABNORMAL LOW (ref 3.87–5.11)
RBC: 2.63 MIL/uL — ABNORMAL LOW (ref 3.87–5.11)
RBC: 2.85 MIL/uL — ABNORMAL LOW (ref 3.87–5.11)
RDW: 14.5 % (ref 11.5–15.5)
RDW: 14.8 % (ref 11.5–15.5)
RDW: 14.5 % (ref 11.5–15.5)
WBC: 15.5 10*3/uL — ABNORMAL HIGH (ref 4.0–10.5)
WBC: 12.4 10*3/uL — ABNORMAL HIGH (ref 4.0–10.5)
WBC: 15.6 10*3/uL — ABNORMAL HIGH (ref 4.0–10.5)
nRBC: 0 % (ref 0.0–0.2)
nRBC: 0 % (ref 0.0–0.2)
nRBC: 0 % (ref 0.0–0.2)

## 2023-07-06 LAB — BPAM CRYOPRECIPITATE
Blood Product Expiration Date: 202410192359
Blood Product Expiration Date: 202410202359
ISSUE DATE / TIME: 202410151242
ISSUE DATE / TIME: 202410151335
Unit Type and Rh: 6200
Unit Type and Rh: 6200

## 2023-07-06 LAB — BASIC METABOLIC PANEL
Anion gap: 6 (ref 5–15)
BUN: 13 mg/dL (ref 6–20)
CO2: 21 mmol/L — ABNORMAL LOW (ref 22–32)
Calcium: 7 mg/dL — ABNORMAL LOW (ref 8.9–10.3)
Chloride: 106 mmol/L (ref 98–111)
Creatinine, Ser: 0.98 mg/dL (ref 0.44–1.00)
GFR, Estimated: >60 mL/min (ref >60)
Glucose, Bld: 99 mg/dL (ref 70–99)
Potassium: 3.7 mmol/L (ref 3.5–5.1)
Sodium: 133 mmol/L — ABNORMAL LOW (ref 135–145)

## 2023-07-06 LAB — MAGNESIUM: Magnesium: 1.3 mg/dL — ABNORMAL LOW (ref 1.7–2.4)

## 2023-07-06 LAB — PREPARE CRYOPRECIPITATE
Unit division: 0
Unit division: 0

## 2023-07-06 LAB — BPAM PLATELET PHERESIS
Blood Product Expiration Date: 202410172359
Blood Product Expiration Date: 202410172359
ISSUE DATE / TIME: 202410151241
ISSUE DATE / TIME: 202410151614
Unit Type and Rh: 6200
Unit Type and Rh: 5100

## 2023-07-06 LAB — BASIC METABOLIC PANEL WITH GFR
Anion gap: 7 (ref 5–15)
BUN: 10 mg/dL (ref 6–20)
CO2: 23 mmol/L (ref 22–32)
Calcium: 8.3 mg/dL — ABNORMAL LOW (ref 8.9–10.3)
Chloride: 104 mmol/L (ref 98–111)
Creatinine, Ser: 0.66 mg/dL (ref 0.44–1.00)
GFR, Estimated: >60 mL/min
Glucose, Bld: 95 mg/dL (ref 70–99)
Potassium: 4.3 mmol/L (ref 3.5–5.1)
Sodium: 134 mmol/L — ABNORMAL LOW (ref 135–145)

## 2023-07-06 MED ORDER — TETANUS-DIPHTH-ACELL PERTUSSIS 5-2.5-18.5 LF-MCG/0.5 IM SUSY: 0.5000 mL | PREFILLED_SYRINGE | Freq: Once | INTRAMUSCULAR | Status: DC

## 2023-07-06 MED ORDER — WITCH HAZEL-GLYCERIN EX PADS: 1.0000 | MEDICATED_PAD | CUTANEOUS | Status: DC | PRN

## 2023-07-06 MED ORDER — DIBUCAINE (PERIANAL) 1 % EX OINT: 1.0000 | TOPICAL_OINTMENT | CUTANEOUS | Status: DC | PRN

## 2023-07-06 MED ORDER — SODIUM CHLORIDE 0.9% IV SOLUTION: Freq: Once | INTRAVENOUS | Status: DC

## 2023-07-06 MED ORDER — ONDANSETRON HCL 4 MG/2ML IJ SOLN
4.0000 mg | Freq: Three times a day (TID) | INTRAMUSCULAR | Status: DC | PRN
  Administered 2023-07-06 – 2023-07-08 (×3): 4 mg via INTRAVENOUS
  Filled 2023-07-06 (×3): qty 2

## 2023-07-06 MED ORDER — MAGNESIUM SULFATE 4 GM/100ML IV SOLN
4.0000 g | Freq: Once | INTRAVENOUS | Status: AC
  Administered 2023-07-06: 4 g via INTRAVENOUS
  Filled 2023-07-06: qty 100

## 2023-07-06 MED ORDER — OXYCODONE HCL 5 MG PO TABS
5.0000 mg | ORAL_TABLET | ORAL | Status: DC | PRN
  Administered 2023-07-06: 10 mg via ORAL
  Administered 2023-07-06: 5 mg via ORAL
  Administered 2023-07-07 – 2023-07-08 (×10): 10 mg via ORAL
  Administered 2023-07-09 (×2): 5 mg via ORAL
  Administered 2023-07-09: 10 mg via ORAL
  Filled 2023-07-06 (×6): qty 2
  Filled 2023-07-06 (×2): qty 1
  Filled 2023-07-06 (×3): qty 2
  Filled 2023-07-06: qty 1
  Filled 2023-07-06 (×3): qty 2

## 2023-07-06 MED ORDER — POTASSIUM CHLORIDE CRYS ER 20 MEQ PO TBCR
40.0000 meq | EXTENDED_RELEASE_TABLET | Freq: Once | ORAL | Status: AC
  Administered 2023-07-06: 40 meq via ORAL
  Filled 2023-07-06: qty 2

## 2023-07-06 MED ORDER — GABAPENTIN 100 MG PO CAPS: 200.0000 mg | ORAL_CAPSULE | Freq: Every day | ORAL | Status: DC

## 2023-07-06 MED ORDER — PRENATAL MULTIVITAMIN CH: 1.0000 | ORAL_TABLET | Freq: Every day | ORAL | Status: DC

## 2023-07-06 MED ORDER — SIMETHICONE 80 MG PO CHEW
80.0000 mg | CHEWABLE_TABLET | Freq: Four times a day (QID) | ORAL | Status: DC
  Administered 2023-07-06 – 2023-07-09 (×13): 80 mg via ORAL
  Filled 2023-07-06 (×15): qty 1

## 2023-07-06 MED ORDER — MAGNESIUM HYDROXIDE 400 MG/5ML PO SUSP
30.0000 mL | ORAL | Status: DC | PRN
  Administered 2023-07-07: 30 mL via ORAL
  Filled 2023-07-06: qty 30

## 2023-07-06 MED ORDER — LACTATED RINGERS IV SOLN: INTRAVENOUS | Status: AC

## 2023-07-06 MED ORDER — CALCIUM GLUCONATE-NACL 2-0.675 GM/100ML-% IV SOLN
2.0000 g | Freq: Once | INTRAVENOUS | Status: AC
  Administered 2023-07-06: 2000 mg via INTRAVENOUS
  Filled 2023-07-06: qty 100

## 2023-07-06 MED ORDER — VALACYCLOVIR HCL 500 MG PO TABS
500.0000 mg | ORAL_TABLET | Freq: Every day | ORAL | Status: DC
  Administered 2023-07-06: 500 mg via ORAL
  Filled 2023-07-06: qty 1

## 2023-07-06 MED ORDER — MAGNESIUM SULFATE 2 GM/50ML IV SOLN
2.0000 g | Freq: Once | INTRAVENOUS | Status: AC
  Administered 2023-07-06: 2 g via INTRAVENOUS
  Filled 2023-07-06: qty 50

## 2023-07-06 MED ORDER — SODIUM CHLORIDE 0.9 % IV SOLN
25.0000 mg | Freq: Four times a day (QID) | INTRAVENOUS | Status: DC | PRN
  Administered 2023-07-06: 25 mg via INTRAVENOUS
  Filled 2023-07-06: qty 1

## 2023-07-06 MED ORDER — MENTHOL 3 MG MT LOZG: 1.0000 | LOZENGE | OROMUCOSAL | Status: DC | PRN

## 2023-07-06 MED ORDER — COCONUT OIL OIL: 1.0000 | TOPICAL_OIL | Status: DC | PRN

## 2023-07-06 MED ORDER — ENOXAPARIN SODIUM 60 MG/0.6ML IJ SOSY
50.0000 mg | PREFILLED_SYRINGE | INTRAMUSCULAR | Status: DC
  Administered 2023-07-07: 50 mg via SUBCUTANEOUS
  Filled 2023-07-06 (×2): qty 0.6

## 2023-07-06 MED ORDER — MEASLES, MUMPS & RUBELLA VAC IJ SOLR: 0.5000 mL | Freq: Once | INTRAMUSCULAR | Status: DC

## 2023-07-06 MED ORDER — ACETAMINOPHEN 500 MG PO TABS: 1000.0000 mg | ORAL_TABLET | Freq: Four times a day (QID) | ORAL | Status: DC

## 2023-07-06 MED ORDER — PRENATAL PLUS 27-1 MG PO TABS
1.0000 | ORAL_TABLET | Freq: Every day | ORAL | Status: DC
  Administered 2023-07-07 – 2023-07-09 (×3): 1 via ORAL
  Filled 2023-07-06 (×5): qty 1

## 2023-07-06 MED ORDER — IBUPROFEN 600 MG PO TABS: 600.0000 mg | ORAL_TABLET | Freq: Four times a day (QID) | ORAL | Status: DC

## 2023-07-06 MED ORDER — KETOROLAC TROMETHAMINE 30 MG/ML IJ SOLN: 30.0000 mg | Freq: Four times a day (QID) | INTRAMUSCULAR | Status: DC

## 2023-07-06 MED ORDER — OXYTOCIN-SODIUM CHLORIDE 30-0.9 UT/500ML-% IV SOLN: 2.5000 [IU]/h | INTRAVENOUS | Status: AC

## 2023-07-06 MED ORDER — DIPHENHYDRAMINE HCL 25 MG PO CAPS: 25.0000 mg | ORAL_CAPSULE | Freq: Four times a day (QID) | ORAL | Status: DC | PRN

## 2023-07-06 MED ORDER — ACETAMINOPHEN 325 MG PO TABS
650.0000 mg | ORAL_TABLET | Freq: Four times a day (QID) | ORAL | Status: DC | PRN
  Administered 2023-07-06: 650 mg via ORAL
  Filled 2023-07-06: qty 2

## 2023-07-06 MED ORDER — GABAPENTIN 300 MG PO CAPS
300.0000 mg | ORAL_CAPSULE | Freq: Three times a day (TID) | ORAL | Status: DC
  Administered 2023-07-06 – 2023-07-09 (×9): 300 mg via ORAL
  Filled 2023-07-06 (×9): qty 1

## 2023-07-06 MED ORDER — PANTOPRAZOLE SODIUM 40 MG PO TBEC
40.0000 mg | DELAYED_RELEASE_TABLET | Freq: Every day | ORAL | Status: DC
  Administered 2023-07-06 – 2023-07-09 (×4): 40 mg via ORAL
  Filled 2023-07-06 (×4): qty 1

## 2023-07-06 MED ORDER — SIMETHICONE 80 MG PO CHEW: 80.0000 mg | CHEWABLE_TABLET | ORAL | Status: DC | PRN

## 2023-07-06 MED ORDER — SIMETHICONE 80 MG PO CHEW: 80.0000 mg | CHEWABLE_TABLET | Freq: Three times a day (TID) | ORAL | Status: DC

## 2023-07-06 MED ORDER — ACETAMINOPHEN 325 MG PO TABS
650.0000 mg | ORAL_TABLET | Freq: Four times a day (QID) | ORAL | Status: DC | PRN
  Administered 2023-07-06 – 2023-07-09 (×3): 650 mg via ORAL
  Filled 2023-07-06 (×3): qty 2

## 2023-07-06 MED ORDER — HYDROMORPHONE HCL 1 MG/ML IJ SOLN
INTRAMUSCULAR | Status: AC
  Filled 2023-07-06: qty 1

## 2023-07-06 MED ORDER — HYDROMORPHONE HCL 1 MG/ML IJ SOLN
1.0000 mg | Freq: Once | INTRAMUSCULAR | Status: AC
  Administered 2023-07-06: 1 mg via INTRAVENOUS

## 2023-07-06 MED ORDER — OXYCODONE HCL 5 MG PO TABS: 5.0000 mg | ORAL_TABLET | Freq: Four times a day (QID) | ORAL | Status: DC | PRN

## 2023-07-06 MED ORDER — ACETAMINOPHEN 325 MG PO TABS
650.0000 mg | ORAL_TABLET | Freq: Four times a day (QID) | ORAL | Status: DC | PRN
  Administered 2023-07-06: 650 mg via ORAL

## 2023-07-06 MED ORDER — DOCUSATE SODIUM 100 MG PO CAPS
100.0000 mg | ORAL_CAPSULE | Freq: Two times a day (BID) | ORAL | Status: DC
  Administered 2023-07-06 – 2023-07-09 (×6): 100 mg via ORAL
  Filled 2023-07-06 (×6): qty 1

## 2023-07-06 MED ORDER — SENNOSIDES-DOCUSATE SODIUM 8.6-50 MG PO TABS
2.0000 | ORAL_TABLET | Freq: Every day | ORAL | Status: DC
  Administered 2023-07-07 – 2023-07-09 (×3): 2 via ORAL
  Filled 2023-07-06 (×3): qty 2

## 2023-07-06 NOTE — Progress Notes (Signed)
Interventional Radiology Progress Note  42 yo female SP emergent pelvic angiogram and embolization for life-threatening, primary post partum hemorrhage. DIC contributing,SP massive transfusion/resuscitation. POD 1  SP rCFA access, pelvic angiogram, gelfoam embolization of left int iliac artery, and right anterior division int iliac artery.  SP rCFV CVC.  Transfusion/resuscitation included: 8uPRBC's, 2uFFP, 1uCryo  This am patient reports right hip feels ok, "only minimal tender". She states her lower back is "sore", with "positional discomfort".  Somewhat thirsty, reports no real appetite yet.   Vitals: HR: 90-106, current 90-100 SBP: 90-113 DBP: 65-81 O2: 94-100  UOP: 950cc last 12hr JADA: last reported 300cc at 4pm  Labs: H&H: 9.1/25.5 WBC: 15.5 Platelet:94 DIC: PTT, INR, PT WNL.  Fibrinogen 319.   Na: 133 K: 3.7 BUN: 13 Cr: 0.98  Right hip straight in bed.  Site of CFA access soft, minimally tender.  No ecchymosis/hematoma. The CVC remains.  Extremities warm, well perfused.  +swelling bilateral.  Motor/sensory intact. Denies any foot pain. She states her swelling is decreasing.   Plan: Ok to remove CVC when IV access secure, and CC team feels no further need.  Right CVA access site routine care.  Continue to trend H&H.  Call with questions concerns.   Signed,  Yvone Neu. Loreta Ave DO, ABVM, RPVI   MDM: SP Acute life-threatening problem, with patient interview/exam, review of labs, history, overnight events, I/O's/data.  Moderate complexity, [id number]

## 2023-07-06 NOTE — Progress Notes (Signed)
NAME:  Kelly Lopez, MRN:  [id number], DOB:  07-Aug-1981, LOS: 2 ADMISSION DATE:  07/04/2023 CONSULTATION DATE:  07/05/2023 REFERRING MD:  Donavan Foil - OB CHIEF COMPLAINT:  Hemorrhagic shock post-C-section   History of Present Illness:  42 year old woman who presented to [facility] 10/14 for induction of labor in the setting of advanced maternal age. [redacted] weeks GA. [id number]. PMHx significant for esophageal dysphagia, HSV-1, psoriasis, anxiety/depression.  Presented initially for induction of labor at [redacted] weeks GA in the setting of advanced maternal age. Unfortunately, had failure to progress/arrest of descent prompting Cesarean section. Taken to OR for C-section 10/15.  Intraoperative course was notable for bleeding vessel on the left superior aspect during hysterotomy; a total of 6 figure of eight stitches were placed with adequate hemostasis. Required JADA balloon placement and has required multiple blood product administrations (PRBCs, FFP) as well as vasopressor support. Given ongoing clinical instability, IR was consulted and patient was taken for pelvic angiogram. Underwent nonselective L and R internal iliac artery embolization with IR.  PCCM consulted for ICU admission post-IR.  Pertinent Medical History:   Past Medical History:  Diagnosis Date   Anxiety    Depression    Esophageal dysphagia    History of retained placenta 08/31/2019   Psoriasis    SVD (spontaneous vaginal delivery) 03/01/2022    Significant Hospital Events: Including procedures, antibiotic start and stop dates in addition to other pertinent events   10/14 - Admitted for IOL in AMA patient, [redacted]wk GA. 10/15 - C-section for failure to progress/arrested descent. Intra-op with bleeding vessel, got hemostasis intra-op. Hypotensive in recovery, per OB copious clot removed. JADA device placed. Concern for DIC. Taken to IR for embolization; underwent nonselective L and R internal iliac artery embolization.  Interim History /  Subjective:  Off pressors, Hgb pretty stable  Objective:  Blood pressure 103/69, pulse (!) 106, temperature 98.4 F (36.9 C), temperature source Oral, resp. rate 15, height 5\' 3"  (1.6 m), weight 104.6 kg, last menstrual period 10/04/2022, SpO2 95%, unknown if currently breastfeeding.        Intake/Output Summary (Last 24 hours) at 07/06/2023 0913 Last data filed at 07/06/2023 0600 Gross per 24 hour  Intake 7662.77 ml  Output 7014 ml  Net 648.77 ml   Filed Weights   07/04/23 0030 07/05/23 1657  Weight: 100.7 kg 104.6 kg   Physical Examination: General: Acutely ill-appearing middle-aged woman in NAD. Pleasant and conversant. HEENT: [place]/AT, anicteric sclera, PERRL, dry mucous membranes. Neuro: Awake, oriented x 4. Responds to verbal stimuli. Following commands consistently. Moves all 4 extremities spontaneously. CV: RRR, no m/g/r. PULM: Breathing even and unlabored on 2LNC. Lung fields CTAB. GI: Soft, mildly distended, appropriately TTP postoperatively. Normoactive bowel sounds. GU: JADA device in place with dark red blood in suction tubing. Extremities: Bilateral symmetric 1-2+ LE edema noted. DP/PT pulses able to be dopplered bilaterally. R femoral CVC. Skin: Warm/dry, no rashes.  Resolved Hospital Problem List:    Assessment & Plan:  Hemorrhagic shock with postpartum hemorrhage s/p C-section S/p embolization (nonselective) of L and R internal iliac artery embolization Failure to progress/arrest of descent prompting C-section. Intra-op course notable for bleeding vessel on the left superior aspect during hysterotomy; a total of 6 figure of eight stitches were placed with adequate hemostasis. In PACU, hypotensive with post-partum hemorrhage. S/p manual clot removal via uterine sweep, JADA device insertion, IM methergine and rectal Cytotec. - S/p IR embolization (nonselective) of L and R internal iliac artery - Goal MAP > 65,  off vasopressors  ABLA secondary to postpartum  hemorrhage S/p 8U PRBCs, 2U FFP, 2U Plt, 2U Cryo  - Trend H&H Q6H, DIC panel - Monitor for signs of active bleeding - Transfuse for Hgb < 7.0 or hemodynamically significant bleeding - JADA device in place, plan removal and if stable transfer out of ICU  Oliguric AKI: Due to transient hemorrhagic shock - Now improved, encourage PO intake  Depression/anxiety - Continue home Effexor  History of HSV-1 - Continue daily acyclovir  Discussed with OB attending. Plan transfer out ICU to post partum service if stable in afternoon.  Best Practice: (right click and "Reselect all SmartList Selections" daily)   Diet/type: Regular consistency (see orders) DVT prophylaxis: SCDs, hold AC/ppx GI prophylaxis: N/A Lines: Central line R femoral CVC Foley:  Yes, and it is still needed Code Status:  full code Last date of multidisciplinary goals of care discussion [Per Primary Team]  Labs:  CBC: Recent Labs  Lab 07/04/23 0043 07/05/23 0913 07/05/23 1032 07/05/23 1145 07/05/23 1158 07/05/23 1338 07/05/23 1524 07/05/23 1750 07/05/23 2113 07/06/23 0315 07/06/23 0316  WBC 12.0* 16.9*  --   --   --   --   --   --   --  15.5*  --   HGB 12.5 12.6  --  7.1*  --  8.2*  --  10.9* 9.3* 9.1*  --   HCT 35.9* 36.8  --  21.0*  --  23.8*  --  30.6* 26.5* 25.5*  --   MCV 87.3 89.1  --   --   --   --   --   --   --  85.9  --   PLT 250 220   < >  --  209 160 119*  --   --  94* 95*   < > = values in this interval not displayed.   Basic Metabolic Panel: Recent Labs  Lab 07/05/23 0236 07/05/23 1145 07/06/23 0315  NA 134* 136 133*  K 3.8 4.8 3.7  CL 103  --  106  CO2 19*  --  21*  GLUCOSE 94  --  99  BUN 12  --  13  CREATININE 1.60*  --  0.98  CALCIUM 8.2*  --  7.0*  MG  --   --  1.3*  PHOS  --   --  5.0*   GFR: Estimated Creatinine Clearance: 88.3 mL/min (by C-G formula based on SCr of 0.98 mg/dL). Recent Labs  Lab 07/04/23 0043 07/05/23 0913 07/06/23 0315  WBC 12.0* 16.9* 15.5*    Liver Function Tests: No results for input(s): "AST", "ALT", "ALKPHOS", "BILITOT", "PROT", "ALBUMIN" in the last 168 hours. No results for input(s): "LIPASE", "AMYLASE" in the last 168 hours. No results for input(s): "AMMONIA" in the last 168 hours.  ABG:    Component Value Date/Time   HCO3 19.0 (L) 07/05/2023 1145   TCO2 20 (L) 07/05/2023 1145   ACIDBASEDEF 7.0 (H) 07/05/2023 1145   O2SAT 28 07/05/2023 1145    Coagulation Profile: Recent Labs  Lab 07/05/23 1032 07/05/23 1158 07/05/23 1338 07/05/23 1524 07/06/23 0316  INR 1.1 1.4* 1.6* 1.6* 1.2   Cardiac Enzymes: No results for input(s): "CKTOTAL", "CKMB", "CKMBINDEX", "TROPONINI" in the last 168 hours.  HbA1C: Hgb A1c MFr Bld  Date/Time Value Ref Range Status  06/12/2020 03:26 PM 5.0 4.8 - 5.6 % Final    Comment:             Prediabetes: 5.7 - 6.4  Diabetes: >6.4          Glycemic control for adults with diabetes: <7.0   08/07/2019 10:00 AM 4.6 (L) 4.8 - 5.6 % Final    Comment:             Prediabetes: 5.7 - 6.4          Diabetes: >6.4          Glycemic control for adults with diabetes: <7.0    CBG: Recent Labs  Lab 07/05/23 1653 07/05/23 2325  GLUCAP 120* 115*    Review of Systems:   Review of systems completed with pertinent positives/negatives outlined in above HPI.  Past Medical History:  She,  has a past medical history of Anxiety, Depression, Esophageal dysphagia, History of retained placenta (08/31/2019), Psoriasis, and SVD (spontaneous vaginal delivery) (03/01/2022).   Surgical History:   Past Surgical History:  Procedure Laterality Date   CESAREAN SECTION N/A 07/05/2023   Procedure: CESAREAN SECTION;  Surgeon: Warden Fillers, MD;  Location: [facility] LD ORS;  Service: Obstetrics;  Laterality: N/A;   DILATION AND EVACUATION N/A 08/31/2019   Procedure: DILATATION AND EVACUATION;  Surgeon: Tereso Newcomer, MD;  Location: [facility] LD ORS;  Service: Gynecology;  Laterality: N/A;   IR ANGIOGRAM  PELVIS SELECTIVE OR SUPRASELECTIVE  07/05/2023   IR ANGIOGRAM SELECTIVE EACH ADDITIONAL VESSEL  07/05/2023   IR ANGIOGRAM SELECTIVE EACH ADDITIONAL VESSEL  07/05/2023   IR EMBO ART  VEN HEMORR LYMPH EXTRAV  INC GUIDE ROADMAPPING  07/05/2023   IR FLUORO GUIDE CV LINE RIGHT  07/05/2023   IR US GUIDE VASC ACCESS RIGHT  07/05/2023   NO PAST SURGERIES     TUBAL LIGATION Bilateral 07/05/2023   Procedure: BILATERAL TUBAL LIGATION;  Surgeon: Warden Fillers, MD;  Location: [facility] LD ORS;  Service: Obstetrics;  Laterality: Bilateral;    Social History:   reports that she has quit smoking. Her smoking use included cigars. She has never used smokeless tobacco. She reports that she does not currently use alcohol. She reports that she does not currently use drugs.   Family History:  Her family history includes Cancer in her maternal grandmother and paternal grandmother; Heart disease in her maternal grandfather and paternal grandfather.   Allergies: Allergies  Allergen Reactions   Sertraline Hcl Other (See Comments)   Ferrous Sulfate Rash   Home Medications: Prior to Admission medications   Medication Sig Start Date End Date Taking? Authorizing Provider  aspirin EC 81 MG tablet Take 1 tablet (81 mg total) by mouth daily. Swallow whole. 02/10/23  Yes Venora Maples, MD  Docusate Sodium (DSS) 100 MG CAPS Take 2 capsules by mouth daily.   Yes [provider]  omeprazole (PRILOSEC) 20 MG capsule Take 20 mg by mouth daily.   Yes [provider]  prenatal vitamin w/FE, FA (PRENATAL 1 + 1) 27-1 MG TABS tablet Take 1 tablet by mouth daily at 12 noon.   Yes [provider]  valACYclovir (VALTREX) 500 MG tablet Take 1 tablet (500 mg total) by mouth daily. 03/10/23  Yes Sue Lush, FNP  venlafaxine XR (EFFEXOR-XR) 75 MG 24 hr capsule Take 75 mg by mouth every morning.   Yes [provider]    Critical care time: n/a     Karren Burly, MD [facility] 07/06/23 9:13 AM  Please see [email or web address] for pager details.  From 7A-7P if no response, please call [phone] After hours, please call ELink [phone]

## 2023-07-06 NOTE — Progress Notes (Signed)
[facility] ADULT ICU REPLACEMENT PROTOCOL   The patient does apply for the [facility] Adult ICU Electrolyte Replacment Protocol based on the criteria listed below:   1.Exclusion criteria: TCTS, ECMO, Dialysis, and Myasthenia Gravis patients 2. Is GFR >/= 30 ml/min? Yes.    Patient's GFR today is >60 3. Is SCr </= 2? Yes.   Patient's SCr is 0.98 mg/dL 4. Did SCr increase >/= 0.5 in 24 hours? No. 5.Pt's weight >40kg  Yes.   6. Abnormal electrolyte(s):   K 3.7, Mg 1.3  7. Electrolytes replaced per protocol 8.  Call MD STAT for K+ </= 2.5, Phos </= 1, or Mag </= 1 Physician:  E. Reyes Ivan R Remedy Corporan 07/06/2023 4:30 AM

## 2023-07-06 NOTE — Progress Notes (Signed)
POSTPARTUM PROGRESS NOTE  POD #1  Subjective:  Kelly Lopez is a 42 y.o. [id number] s/p pLTCS and salpingectomy complicated by PPH/DIC requiring JADA, [place] and multiple blood products.   Today she is resting comfortably, now on room air.  Pt weened off pressors last night and O2 this am.  She has not yet ambulated.  Overnight lochia has been minimal with JADA in place.  Denies passing flatus or BM.  Denies fever/chills/chest pain/SOB. Foley in place  Objective: Blood pressure 107/73, pulse 91, temperature 98.4 F (36.9 C), temperature source Oral, resp. rate 16, height 5\' 3"  (1.6 m), weight 104.6 kg, last menstrual period 10/04/2022, SpO2 99%, unknown if currently breastfeeding.  Physical Exam:  General: alert, cooperative and no distress Chest: no respiratory distress, CTAB Heart: regular rate and rhythm Abdomen: obese, soft, mild distension, BS quiet Uterine Fundus: firm, appropriately tender, below umbilcus Incision: clean and dry with honeycomb GU: JADA disconnected from suction x , balloon deflated and removed without difficulty.  ~ 50cc of old dark clot noted, no active bleeding appreciated DVT Evaluation: No calf swelling or tenderness Extremities: 1+ edema, SCDs in place Skin: warm, dry Foley in place with light yellow urine  Results for orders placed or performed during the hospital encounter of 07/04/23 (from the past 24 hour(s))  Prepare RBC (crossmatch)     Status: None   Collection Time: 07/05/23 11:16 AM  Result Value Ref Range   Order Confirmation      ORDER PROCESSED BY BLOOD BANK Performed at [facility], [address], [place], [place] [address]   POCT I-Stat EG7     Status: Abnormal   Collection Time: 07/05/23 11:45 AM  Result Value Ref Range   pH, Ven 7.262 7.25 - 7.43   pCO2, Ven 42.2 (L) 44 - 60 mmHg   pO2, Ven 21 (LL) 32 - 45 mmHg   Bicarbonate 19.0 (L) 20.0 - 28.0 mmol/L   TCO2 20 (L) 22 - 32 mmol/L   O2 Saturation 28 %   Acid-base  deficit 7.0 (H) 0.0 - 2.0 mmol/L   Sodium 136 135 - 145 mmol/L   Potassium 4.8 3.5 - 5.1 mmol/L   Calcium, Ion 1.10 (L) 1.15 - 1.40 mmol/L   HCT 21.0 (L) 36.0 - 46.0 %   Hemoglobin 7.1 (L) 12.0 - 15.0 g/dL   Sample type VENOUS    Comment NOTIFIED PHYSICIAN   DIC Panel ONCE - STAT     Status: Abnormal   Collection Time: 07/05/23 11:58 AM  Result Value Ref Range   Prothrombin Time 17.0 (H) 11.4 - 15.2 seconds   INR 1.4 (H) 0.8 - 1.2   aPTT 37 (H) 24 - 36 seconds   Fibrinogen 302 210 - 475 mg/dL   D-Dimer, Quant 6.57 (H) 0.00 - 0.50 ug/mL-FEU   Platelets 209 150 - 400 K/uL   Smear Review NO SCHISTOCYTES SEEN   Prepare fresh frozen plasma     Status: None (Preliminary result)   Collection Time: 07/05/23 12:03 PM  Result Value Ref Range   Unit Number [id number]    Blood Component Type THW PLS APHR    Unit division A0    Status of Unit ISSUED,FINAL    Transfusion Status      OK TO TRANSFUSE Performed at [facility], [address] [place], [place] [address]    Unit Number [id number]    Blood Component Type THW PLS APHR    Unit division B0  Status of Unit ISSUED,FINAL    Transfusion Status OK TO TRANSFUSE    Unit Number [id number]    Blood Component Type LIQ PLASMA    Unit division 00    Status of Unit REL FROM [facility]    Unit tag comment VERBAL ORDERS PER DR DR Donavan Foil    Transfusion Status OK TO TRANSFUSE    Unit Number [id number]    Blood Component Type LIQ PLASMA    Unit division 00    Status of Unit REL FROM [facility]    Unit tag comment VERBAL ORDERS PER DR DR Donavan Foil    Transfusion Status OK TO TRANSFUSE    Unit Number [id number]    Blood Component Type LIQ PLASMA    Unit division 00    Status of Unit REL FROM [facility]    Unit tag comment VERBAL ORDERS PER DR DR Donavan Foil    Transfusion Status OK TO TRANSFUSE    Unit Number [id number]    Blood Component Type LIQ PLASMA    Unit division 00    Status of Unit REL FROM [facility]    Unit tag comment VERBAL  ORDERS PER DR DR Donavan Foil    Transfusion Status OK TO TRANSFUSE    Unit Number [id number]    Blood Component Type LIQ PLASMA    Unit division 00    Status of Unit REL FROM [facility]    Unit tag comment VERBAL ORDERS PER DR DR Donavan Foil    Transfusion Status OK TO TRANSFUSE    Unit Number [id number]    Blood Component Type LIQ PLASMA    Unit division 00    Status of Unit REL FROM [facility]    Unit tag comment VERBAL ORDERS PER DR DR Donavan Foil    Transfusion Status OK TO TRANSFUSE    Unit Number [id number]    Blood Component Type LIQ PLASMA    Unit division 00    Status of Unit REL FROM [facility]    Unit tag comment VERBAL ORDERS PER DR DR Donavan Foil    Transfusion Status OK TO TRANSFUSE    Unit Number [id number]    Blood Component Type LIQ PLASMA    Unit division 00    Status of Unit REL FROM [facility]    Unit tag comment VERBAL ORDERS PER DR DR Donavan Foil    Transfusion Status OK TO TRANSFUSE    Unit Number [id number]    Blood Component Type THW PLS APHR    Unit division 00    Status of Unit REL FROM [facility]    Transfusion Status OK TO TRANSFUSE    Unit Number [id number]    Blood Component Type THW PLS APHR    Unit division 00    Status of Unit REL FROM [facility]    Transfusion Status OK TO TRANSFUSE    Unit Number [id number]    Blood Component Type THW PLS APHR    Unit division A0    Status of Unit REL FROM [facility]    Transfusion Status OK TO TRANSFUSE    Unit Number [id number]    Blood Component Type THW PLS APHR    Unit division A0    Status of Unit REL FROM [facility]    Transfusion Status OK TO TRANSFUSE    Unit Number [id number]    Blood Component Type THAWED PLASMA    Unit division 00    Status of Unit REL FROM [facility]    Transfusion Status OK TO TRANSFUSE    Unit Number [id number]  Blood Component Type THAWED PLASMA    Unit division 00    Status of Unit REL FROM [facility]    Transfusion Status OK TO TRANSFUSE    Unit Number [id number]    Blood Component Type THAWED PLASMA     Unit division 00    Status of Unit ALLOCATED    Transfusion Status OK TO TRANSFUSE    Unit Number [id number]    Blood Component Type THAWED PLASMA    Unit division 00    Status of Unit ALLOCATED    Transfusion Status OK TO TRANSFUSE   Prepare RBC (crossmatch)     Status: None   Collection Time: 07/05/23 12:10 PM  Result Value Ref Range   Order Confirmation      ORDER PROCESSED BY BLOOD BANK Performed at [facility], [address], [place], [place] [address]   Prepare cryoprecipitate     Status: None   Collection Time: 07/05/23 12:39 PM  Result Value Ref Range   Unit Number [id number]    Blood Component Type POOL FIBR CMPLX 2D THW    Unit division 00    Status of Unit ISSUED,FINAL    Transfusion Status OK TO TRANSFUSE    Unit Number [id number]    Blood Component Type POOL FIBR CMPLX 2D THW    Unit division 00    Status of Unit ISSUED,FINAL    Transfusion Status      OK TO TRANSFUSE Performed at [facility], [address], [place], [place] [address]   Prepare platelet pheresis     Status: None   Collection Time: 07/05/23 12:39 PM  Result Value Ref Range   Unit Number [id number]    Blood Component Type PLTP2 PSORALEN TREATED    Unit division 00    Status of Unit ISSUED,FINAL    Transfusion Status      OK TO TRANSFUSE Performed at [facility], [address], [place], [place] [address]   Hemoglobin and hematocrit, blood     Status: Abnormal   Collection Time: 07/05/23  1:38 PM  Result Value Ref Range   Hemoglobin 8.2 (L) 12.0 - 15.0 g/dL   HCT 51.8 (L) 84.1 - 66.0 %  DIC Panel Add-On to previous collection     Status: Abnormal   Collection Time: 07/05/23  1:38 PM  Result Value Ref Range   Prothrombin Time 19.3 (H) 11.4 - 15.2 seconds   INR 1.6 (H) 0.8 - 1.2   aPTT 36 24 - 36 seconds   Fibrinogen 187 (L) 210 - 475 mg/dL   D-Dimer, Quant 63.01 (H) 0.00 - 0.50 ug/mL-FEU   Platelets 160 150 - 400 K/uL   Smear Review NO SCHISTOCYTES  SEEN   DIC Panel ONCE - STAT     Status: Abnormal   Collection Time: 07/05/23  3:24 PM  Result Value Ref Range   Prothrombin Time 19.7 (H) 11.4 - 15.2 seconds   INR 1.6 (H) 0.8 - 1.2   aPTT 33 24 - 36 seconds   Fibrinogen 178 (L) 210 - 475 mg/dL   D-Dimer, Quant 60.10 (H) 0.00 - 0.50 ug/mL-FEU   Platelets 119 (L) 150 - 400 K/uL   Smear Review NO SCHISTOCYTES SEEN   Prepare cryoprecipitate     Status: None   Collection Time: 07/05/23  3:52 PM  Result Value Ref Range   Unit Number [id number]    Blood Component Type POOL FIBR CMPLX 2D THW    Unit  division 00    Status of Unit REL FROM [facility]    Transfusion Status OK TO TRANSFUSE   Prepare platelet pheresis     Status: None   Collection Time: 07/05/23  3:58 PM  Result Value Ref Range   Unit Number [id number]    Blood Component Type PLTP2 PSORALEN TREATED    Unit division 00    Status of Unit ISSUED,FINAL    Transfusion Status      OK TO TRANSFUSE Performed at [facility], [address] [place], [place] [address]   Glucose, capillary     Status: Abnormal   Collection Time: 07/05/23  4:53 PM  Result Value Ref Range   Glucose-Capillary 120 (H) 70 - 99 mg/dL  MRSA Next Gen by PCR, Nasal     Status: None   Collection Time: 07/05/23  5:02 PM   Specimen: Nasal Mucosa; Nasal Swab  Result Value Ref Range   MRSA by PCR Next Gen NOT DETECTED NOT DETECTED  Hemoglobin and hematocrit, blood     Status: Abnormal   Collection Time: 07/05/23  5:50 PM  Result Value Ref Range   Hemoglobin 10.9 (L) 12.0 - 15.0 g/dL   HCT 13.0 (L) 86.5 - 78.4 %  Hemoglobin and hematocrit, blood     Status: Abnormal   Collection Time: 07/05/23  9:13 PM  Result Value Ref Range   Hemoglobin 9.3 (L) 12.0 - 15.0 g/dL   HCT 69.6 (L) 29.5 - 28.4 %  Glucose, capillary     Status: Abnormal   Collection Time: 07/05/23 11:25 PM  Result Value Ref Range   Glucose-Capillary 115 (H) 70 - 99 mg/dL  CBC     Status: Abnormal   Collection Time: 07/06/23  3:15  AM  Result Value Ref Range   WBC 15.5 (H) 4.0 - 10.5 K/uL   RBC 2.97 (L) 3.87 - 5.11 MIL/uL   Hemoglobin 9.1 (L) 12.0 - 15.0 g/dL   HCT 13.2 (L) 44.0 - 10.2 %   MCV 85.9 80.0 - 100.0 fL   MCH 30.6 26.0 - 34.0 pg   MCHC 35.7 30.0 - 36.0 g/dL   RDW 72.5 36.6 - 44.0 %   Platelets 94 (L) 150 - 400 K/uL   nRBC 0.0 0.0 - 0.2 %  Basic metabolic panel     Status: Abnormal   Collection Time: 07/06/23  3:15 AM  Result Value Ref Range   Sodium 133 (L) 135 - 145 mmol/L   Potassium 3.7 3.5 - 5.1 mmol/L   Chloride 106 98 - 111 mmol/L   CO2 21 (L) 22 - 32 mmol/L   Glucose, Bld 99 70 - 99 mg/dL   BUN 13 6 - 20 mg/dL   Creatinine, Ser 3.47 0.44 - 1.00 mg/dL   Calcium 7.0 (L) 8.9 - 10.3 mg/dL   GFR, Estimated >42 >59 mL/min   Anion gap 6 5 - 15  Magnesium     Status: Abnormal   Collection Time: 07/06/23  3:15 AM  Result Value Ref Range   Magnesium 1.3 (L) 1.7 - 2.4 mg/dL  Phosphorus     Status: Abnormal   Collection Time: 07/06/23  3:15 AM  Result Value Ref Range   Phosphorus 5.0 (H) 2.5 - 4.6 mg/dL  DIC Panel ONCE - STAT     Status: Abnormal   Collection Time: 07/06/23  3:16 AM  Result Value Ref Range   Prothrombin Time 15.2 11.4 - 15.2 seconds   INR 1.2 0.8 -  1.2   aPTT 28 24 - 36 seconds   Fibrinogen 319 210 - 475 mg/dL   D-Dimer, Quant 9.60 (H) 0.00 - 0.50 ug/mL-FEU   Platelets 95 (L) 150 - 400 K/uL   Smear Review NO SCHISTOCYTES SEEN     Assessment/Plan: Wendelin Reader Grulke is a 42 y.o. [id number] s/p pLTCS and salpingectomy at [redacted]w[redacted]d POD#1 complicated by: 1) PPH/DIC -JADA removed this am, lochia appropriate -no evidence of bleeding -vitals stable, off pressors -labs as above, improvement noted and appear stable -repeat labs ordered for now -appreciate critical care co-management   2) Postop -plan for ambulation -Foley to be removed today -will transition to oral pain medication- oxycodone, tylenol, gabapentin -plan to hold NSAIDs -plan to transfer to [facility] specialty  care -discussed with pt concern for postop ileus- mylicon, miralax and colace on board.  Encouraged ambulation today  3) Anxiety -continue home meds   Contraception: bilateral salpingectomy Feeding: breast/bottle  Dispo: As outlined above, plan to transfer out of critical care   LOS: 2 days   Myna Hidalgo, DO Attending Obstetrician & Gynecologist, Faculty Practice Center for [employer], [facility]

## 2023-07-06 NOTE — Progress Notes (Signed)
Chaplain introduced spiritual care in the setting of ICU admission. Chaplain was aware that Kelly Lopez has a history of loss with several deliveries in this hospital. Chaplain asked open ended questions to facilitate emotional expression and story telling. Kelly Lopez shared that [name] is doing well, but she is still struggling. Chaplain acknowledged the significant trauma of Kelly Lopez's birth and that despite having a healthy baby, Kelly Lopez still lost so much of the birth experience she has been waiting for. Kelly Lopez tearfully shared her experience and the support she is getting from her mother and sister. She is hopeful that she will soon be able to move back to [facility] and see her son. She accepted an SD photo card from her loss last year and asked chaplain to email her resources for grief support.  Please page as further needs arise.  Maryanna Shape. Carley Hammed, M.Div. [facility] Chaplain Pager [phone] Office [phone]

## 2023-07-06 NOTE — Plan of Care (Signed)

## 2023-07-06 NOTE — Progress Notes (Signed)
eLink Physician-Brief Progress Note Patient Name: Kelly Lopez DOB: 14-Feb-1981 MRN: [id number]   Date of Service  07/06/2023  HPI/Events of Note  RN reports patient complaining of 5/10 shoulder/back pain...asking for prn pain med.  Can take PO  eICU Interventions  Tylenol prn ordered     Intervention Category Intermediate Interventions: Pain - evaluation and management  Darl Pikes 07/06/2023, 4:13 AM

## 2023-07-07 LAB — SURGICAL PATHOLOGY

## 2023-07-07 LAB — MAGNESIUM: Magnesium: 1.8 mg/dL (ref 1.7–2.4)

## 2023-07-07 NOTE — Progress Notes (Signed)
Subjective: Postpartum Day 2: Cesarean Delivery Patient reports incisional pain, tolerating PO, and no problems voiding.    Objective: Vital signs in last 24 hours: Temp:  [98.2 F (36.8 C)-99.6 F (37.6 C)] 98.2 F (36.8 C) (10/17 0759) Pulse Rate:  [87-116] 107 (10/17 0759) Resp:  [9-24] 18 (10/17 0759) BP: (110-135)/(64-84) 124/71 (10/17 0759) SpO2:  [89 %-100 %] 94 % (10/17 0759)  Physical Exam:  General: alert, cooperative, and no distress Lochia: appropriate Uterine Fundus: firm Incision: healing well, no significant drainage DVT Evaluation: No evidence of DVT seen on physical exam.  Recent Labs    07/06/23 1308 07/06/23 2003  HGB 8.2* 8.7*  HCT 23.3* 24.8*    Assessment/Plan: Status post Cesarean section. Doing well postoperatively.  Continue current care.  Scheryl Darter, MD 07/07/2023, 10:38 AM

## 2023-07-07 NOTE — Addendum Note (Signed)
Addendum  created 07/07/23 1037 by Graciela Husbands, CRNA   Intraprocedure Staff edited

## 2023-07-07 NOTE — Lactation Note (Signed)
This note was copied from a baby's chart. Lactation Consultation Note  Patient Name: Kelly Lopez [id number] Date: 07/07/2023 Age:40 hours Reason for consult: Initial assessment;Primapara;Term Spoke w/mom and she stated she has been through to much so she is just going to stay w/formula feeding.  Maternal Data    Feeding Mother's Current Feeding Choice: Formula Nipple Type: Slow - flow  LATCH Score                    Lactation Tools Discussed/Used    Interventions    Discharge    Consult Status Consult Status: Complete    Tonjia Parillo G 07/07/2023, 9:22 PM

## 2023-07-07 NOTE — Addendum Note (Signed)
Addendum  created 07/07/23 1042 by Graciela Husbands, CRNA   Intraprocedure Staff edited

## 2023-07-07 NOTE — Progress Notes (Signed)
MOB was referred for history of depression/anxiety. * Referral screened out by Clinical Social Worker because none of the following criteria appear to apply: ~ History of anxiety/depression during this pregnancy, or of post-partum depression following prior delivery. ~ Diagnosis of anxiety and/or depression within last 3 years OR * MOB's symptoms currently being treated with medication and/or therapy. Per chart review MOB is currently prescribed/taking Effexor-xr and participating in counseling.   Please contact the Clinical Social Worker if needs arise, by [facility] request, or if MOB scores greater than 9/yes to question 10 on Edinburgh Postpartum Depression Screen.  Celso Sickle, LCSW Clinical Social Worker [facility] Cell#: [phone]

## 2023-07-08 ENCOUNTER — Other Ambulatory Visit: Payer: Self-pay | Admitting: Obstetrics & Gynecology

## 2023-07-08 LAB — BPAM RBC
Blood Product Expiration Date: 202411092359
Blood Product Expiration Date: 202411102359
Blood Product Expiration Date: 202411102359
Blood Product Expiration Date: 202411112359
Blood Product Expiration Date: 202411112359
Blood Product Expiration Date: 202411112359
Blood Product Expiration Date: 202411112359
Blood Product Expiration Date: 202411112359
Blood Product Expiration Date: 202411122359
Blood Product Expiration Date: 202411122359
Blood Product Expiration Date: 202411122359
Blood Product Expiration Date: 202411122359
Blood Product Expiration Date: 202411122359
Blood Product Expiration Date: 202411122359
Blood Product Expiration Date: 202411132359
Blood Product Expiration Date: 202411142359
Blood Product Expiration Date: 202411142359
Blood Product Expiration Date: 202411142359
Blood Product Expiration Date: 202411142359
Blood Product Expiration Date: 202411142359
Blood Product Expiration Date: 202411152359
Blood Product Expiration Date: 202411192359
Blood Product Expiration Date: 202411192359
Blood Product Expiration Date: 202411192359
ISSUE DATE / TIME: 202410151124
ISSUE DATE / TIME: 202410151124
ISSUE DATE / TIME: 202410151213
ISSUE DATE / TIME: 202410151213
ISSUE DATE / TIME: 202410151213
ISSUE DATE / TIME: 202410151213
ISSUE DATE / TIME: 202410151240
ISSUE DATE / TIME: 202410151240
ISSUE DATE / TIME: 202410151240
ISSUE DATE / TIME: 202410151240
ISSUE DATE / TIME: 202410151240
ISSUE DATE / TIME: 202410151240
ISSUE DATE / TIME: 202410151240
ISSUE DATE / TIME: 202410151240
ISSUE DATE / TIME: 202410151248
ISSUE DATE / TIME: 202410151248
ISSUE DATE / TIME: 202410151248
ISSUE DATE / TIME: 202410151248
ISSUE DATE / TIME: 202410161126
ISSUE DATE / TIME: 202410161551
ISSUE DATE / TIME: 202410161929
ISSUE DATE / TIME: 202410162004
ISSUE DATE / TIME: 202410162038
ISSUE DATE / TIME: 202410162244
Unit Type and Rh: 5100
Unit Type and Rh: 5100
Unit Type and Rh: 5100
Unit Type and Rh: 5100
Unit Type and Rh: 5100
Unit Type and Rh: 5100
Unit Type and Rh: 5100
Unit Type and Rh: 5100
Unit Type and Rh: 6200
Unit Type and Rh: 6200
Unit Type and Rh: 6200
Unit Type and Rh: 6200
Unit Type and Rh: 6200
Unit Type and Rh: 6200
Unit Type and Rh: 6200
Unit Type and Rh: 6200
Unit Type and Rh: 6200
Unit Type and Rh: 6200
Unit Type and Rh: 6200
Unit Type and Rh: 6200
Unit Type and Rh: 6200
Unit Type and Rh: 6200
Unit Type and Rh: 6200
Unit Type and Rh: 6200

## 2023-07-08 LAB — TYPE AND SCREEN
ABO/RH(D): A POS
Antibody Screen: NEGATIVE
Unit division: 0
Unit division: 0
Unit division: 0
Unit division: 0
Unit division: 0
Unit division: 0
Unit division: 0
Unit division: 0
Unit division: 0
Unit division: 0
Unit division: 0
Unit division: 0
Unit division: 0
Unit division: 0
Unit division: 0
Unit division: 0
Unit division: 0
Unit division: 0
Unit division: 0
Unit division: 0
Unit division: 0
Unit division: 0
Unit division: 0
Unit division: 0

## 2023-07-08 LAB — CBC
HCT: 20.6 % — ABNORMAL LOW (ref 36.0–46.0)
Hemoglobin: 7 g/dL — ABNORMAL LOW (ref 12.0–15.0)
MCH: 30.8 pg (ref 26.0–34.0)
MCHC: 34 g/dL (ref 30.0–36.0)
MCV: 90.7 fL (ref 80.0–100.0)
Platelets: 125 10*3/uL — ABNORMAL LOW (ref 150–400)
RBC: 2.27 MIL/uL — ABNORMAL LOW (ref 3.87–5.11)
RDW: 14.1 % (ref 11.5–15.5)
WBC: 8 10*3/uL (ref 4.0–10.5)
nRBC: 0.3 % — ABNORMAL HIGH (ref 0.0–0.2)

## 2023-07-08 LAB — PREPARE RBC (CROSSMATCH)

## 2023-07-08 MED ORDER — SODIUM CHLORIDE 0.9% IV SOLUTION: Freq: Once | INTRAVENOUS | Status: AC

## 2023-07-08 MED ORDER — FERRIC MALTOL 30 MG PO CAPS: 1.0000 | ORAL_CAPSULE | Freq: Two times a day (BID) | ORAL | 2 refills | Status: AC

## 2023-07-08 NOTE — Progress Notes (Signed)
Patient ID: CATHEE HEASLIP, female   DOB: 1980-10-27, 42 y.o.   MRN: [id number] CBC    Component Value Date/Time   WBC 8.0 07/08/2023 0902   RBC 2.27 (L) 07/08/2023 0902   HGB 7.0 (L) 07/08/2023 0902   HGB 12.5 04/07/2023 0913   HCT 20.6 (L) 07/08/2023 0902   HCT 36.7 04/07/2023 0913   PLT 125 (L) 07/08/2023 0902   PLT 258 04/07/2023 0913   MCV 90.7 07/08/2023 0902   MCV 92 04/07/2023 0913   MCH 30.8 07/08/2023 0902   MCHC 34.0 07/08/2023 0902   RDW 14.1 07/08/2023 0902   RDW 12.6 04/07/2023 0913   LYMPHSABS 0.9 03/21/2019 2020   MONOABS 0.4 03/21/2019 2020   EOSABS 0.0 03/21/2019 2020   BASOSABS 0.0 03/21/2019 2020   Discussed her low HB with sx. She agrees to blood transfusion  Adam Phenix, MD

## 2023-07-08 NOTE — Plan of Care (Signed)
CH[facility] Tonsillectomy/Adenoidectomy, Postoperative PEDS care plan entered in error.

## 2023-07-08 NOTE — Progress Notes (Signed)
Patient ID: DEBRO HILT, female   DOB: [birth year], 42 y.o.   MRN: [id number] Circumcision Consent  Discussed with mom at bedside about circumcision.   Circumcision is a surgery that removes the skin that covers the tip of the penis, called the "foreskin." Circumcision is usually done when a boy is between 62 and 19 days old, sometimes up to 53-72 weeks old.  The most common reasons boys are circumcised include for cultural/religious beliefs or for parental preference (potentially easier to clean, so baby looks like daddy, etc).  There may be some medical benefits for circumcision:   Circumcised boys seem to have slightly lower rates of: ? Urinary tract infections (per the American Academy of Pediatrics an uncircumcised boy has a 1/100 chance of developing a UTI in the first year of life, a circumcised boy at a 09/998 chance of developing a UTI in the first year of life- a 10% reduction) ? Penis cancer (typically rare- an uncircumcised female has a 1 in 100,000 chance of developing cancer of the penis) ? Sexually transmitted infection (in endemic areas, including HIV, HPV and Herpes- circumcision does NOT protect against gonorrhea, chlamydia, trachomatis, or syphilis) ? Phimosis: a condition where that makes retraction of the foreskin over the glans impossible (0.4 per 1000 boys per year or 0.6% of boys are affected by their 15th birthday)  Boys and men who are not circumcised can reduce these extra risks by: ? Cleaning their penis well ? Using condoms during sex  What are the risks of circumcision?  As with any surgical procedure, there are risks and complications. In circumcision, complications are rare and usually minor, the most common being: ? Bleeding- risk is reduced by holding each clamp for 30 seconds prior to a cut being made, and by holding pressure after the procedure is done ? Infection- the penis is cleaned prior to the procedure, and the procedure is done under sterile  technique ? Damage to the urethra or amputation of the penis  How is circumcision done in baby boys?  The baby will be placed on a special table and the legs restrained for their safety. Numbing medication is injected into the penis, and the skin is cleansed with betadine to decrease the risk of infection.   What to expect:  The penis will look red and raw for 5-7 days as it heals. We expect scabbing around where the cut was made, as well as clear-pink fluid and some swelling of the penis right after the procedure. If your baby's circumcision starts to bleed or develops pus, please contact your pediatrician immediately.  All questions were answered and mother consented.  Adam Phenix, MD\

## 2023-07-08 NOTE — Progress Notes (Signed)
POSTPARTUM PROGRESS NOTE  POD #3  Subjective:  Kelly Lopez is a 42 y.o. [id number] s/p primary LTCS at [redacted]w[redacted]d with severe postpartum hemorrhage with DIC.  No acute events overnight. She reports she is doing well. She denies any problems with  voiding or po intake. Denies nausea or vomiting. She has  passed flatus. Pain is well controlled.  Lochia is normal.  Pt states she is a little weak when walking and she desires a recheck of her h/h.  Objective: Blood pressure 122/72, pulse 91, temperature 98.6 F (37 C), temperature source Oral, resp. rate 18, height 5\' 3"  (1.6 m), weight 104.6 kg, last menstrual period 10/04/2022, SpO2 94%, unknown if currently breastfeeding.  Physical Exam:  General: alert, cooperative and no distress Chest: no respiratory distress, CTA bilaterally Heart:regular rate and rhythm Abdomen: soft, nontender, nondistended, positive bowel sounds Uterine Fundus: firm, appropriately tender DVT Evaluation: No calf swelling or tenderness Extremities: trace edema Skin: warm, dry; pale, incision clean/dry/intact w/ honeycomb dressing in place  Recent Labs    07/06/23 1308 07/06/23 2003  HGB 8.2* 8.7*  HCT 23.3* 24.8*    Assessment/Plan: Kelly Lopez is a 42 y.o. [id number] s/p primary c section with DIC at [redacted]w[redacted]d.  POD#3 -  Contraception: tubal ligation Will check cbc due to mild fatigue.  If slightly low will consider IV iron.  Otherwise d/c home with wound check in 1 week. Pt desires consultation for sleep study after discharge if possible.   LOS: 4 days   Mariel Aloe, Md Faculty Attending, Center for [employer] 07/08/2023, 7:27 AM

## 2023-07-09 LAB — BPAM FFP
Blood Product Expiration Date: 202410172359
Blood Product Expiration Date: 202410172359
Blood Product Expiration Date: 202410202359
Blood Product Expiration Date: 202410202359
Blood Product Expiration Date: 202410202359
Blood Product Expiration Date: 202410202359
Blood Product Expiration Date: 202410202359
Blood Product Expiration Date: 202410202359
Blood Product Expiration Date: 202410202359
Blood Product Expiration Date: 202410202359
Blood Product Expiration Date: 202410282359
Blood Product Expiration Date: 202410282359
Blood Product Expiration Date: 202410282359
Blood Product Expiration Date: 202410282359
Blood Product Expiration Date: 202410292359
Blood Product Expiration Date: 202411032359
Blood Product Expiration Date: 202411032359
Blood Product Expiration Date: 202411032359
ISSUE DATE / TIME: 202410151214
ISSUE DATE / TIME: 202410151214
ISSUE DATE / TIME: 202410151240
ISSUE DATE / TIME: 202410151240
ISSUE DATE / TIME: 202410151240
ISSUE DATE / TIME: 202410151240
ISSUE DATE / TIME: 202410151240
ISSUE DATE / TIME: 202410151240
ISSUE DATE / TIME: 202410151240
ISSUE DATE / TIME: 202410151240
ISSUE DATE / TIME: 202410192228
ISSUE DATE / TIME: 202410192238
ISSUE DATE / TIME: 202410192238
ISSUE DATE / TIME: 202410192238
ISSUE DATE / TIME: 202410192238
ISSUE DATE / TIME: 202410192238
Unit Type and Rh: 600
Unit Type and Rh: 6200
Unit Type and Rh: 6200
Unit Type and Rh: 6200
Unit Type and Rh: 6200
Unit Type and Rh: 6200
Unit Type and Rh: 6200
Unit Type and Rh: 6200
Unit Type and Rh: 6200
Unit Type and Rh: 6200
Unit Type and Rh: 6200
Unit Type and Rh: 6200
Unit Type and Rh: 6200
Unit Type and Rh: 6200
Unit Type and Rh: 6200
Unit Type and Rh: 6200
Unit Type and Rh: 6200
Unit Type and Rh: 6200

## 2023-07-09 LAB — CBC
HCT: 21.6 % — ABNORMAL LOW (ref 36.0–46.0)
Hemoglobin: 7.5 g/dL — ABNORMAL LOW (ref 12.0–15.0)
MCH: 30.5 pg (ref 26.0–34.0)
MCHC: 34.7 g/dL (ref 30.0–36.0)
MCV: 87.8 fL (ref 80.0–100.0)
Platelets: 154 10*3/uL (ref 150–400)
RBC: 2.46 MIL/uL — ABNORMAL LOW (ref 3.87–5.11)
RDW: 14 % (ref 11.5–15.5)
WBC: 7.3 10*3/uL (ref 4.0–10.5)
nRBC: 0.4 % — ABNORMAL HIGH (ref 0.0–0.2)

## 2023-07-09 LAB — PREPARE FRESH FROZEN PLASMA
Unit division: 0
Unit division: 0
Unit division: 0
Unit division: 0
Unit division: 0
Unit division: 0
Unit division: 0
Unit division: 0
Unit division: 0
Unit division: 0
Unit division: 0
Unit division: 0
Unit division: 0
Unit division: 0

## 2023-07-09 LAB — TYPE AND SCREEN
ABO/RH(D): A POS
Antibody Screen: NEGATIVE
Unit division: 0

## 2023-07-09 LAB — BPAM RBC
Blood Product Expiration Date: 202411182359
ISSUE DATE / TIME: 202410181709
Unit Type and Rh: 6200

## 2023-07-09 MED ORDER — OXYCODONE HCL 5 MG PO TABS: 5.0000 mg | ORAL_TABLET | ORAL | 0 refills | Status: DC | PRN

## 2023-07-15 ENCOUNTER — Other Ambulatory Visit: Payer: Self-pay

## 2023-07-15 ENCOUNTER — Ambulatory Visit (INDEPENDENT_AMBULATORY_CARE_PROVIDER_SITE_OTHER): Payer: No Typology Code available for payment source

## 2023-07-15 VITALS — BP 123/81 | Ht 63.0 in | Wt 192.6 lb

## 2023-07-15 DIAGNOSIS — Z5189 Encounter for other specified aftercare: Secondary | ICD-10-CM

## 2023-07-15 NOTE — Progress Notes (Signed)
Pt here today for wound check s/p rpt c-section on 07/05/23.  Pt reports mild pain and VB.  Incision observed with dry steri strips.  Steri strips removed.  Incision well approximated- no odor, no drainage, no edema, and no erythema.  Pt advise to continue to monitor for sx's of infection, to contact office with concerns, and that we will f/u with her at her pp visit on 08/15/23.  Pt verbalized understanding with no further questions.   Addison Naegeli, RN  07/15/23

## 2023-07-17 ENCOUNTER — Encounter: Payer: Self-pay | Admitting: Obstetrics and Gynecology

## 2023-07-21 ENCOUNTER — Encounter: Payer: Self-pay | Admitting: General Practice

## 2023-07-21 ENCOUNTER — Encounter: Payer: Self-pay | Admitting: Obstetrics and Gynecology

## 2023-08-15 ENCOUNTER — Other Ambulatory Visit: Payer: Self-pay

## 2023-08-15 ENCOUNTER — Ambulatory Visit: Payer: No Typology Code available for payment source | Admitting: Obstetrics & Gynecology

## 2023-08-15 VITALS — BP 109/78 | HR 102 | Wt 191.0 lb

## 2023-08-15 DIAGNOSIS — Z98891 History of uterine scar from previous surgery: Secondary | ICD-10-CM

## 2023-08-15 NOTE — Progress Notes (Signed)
    Post Partum Visit Note  Kelly Lopez is a 42 y.o. [phone] female who presents for a postpartum visit. She is 5.6 weeks postpartum following a primary cesarean section.  I have fully reviewed the prenatal and intrapartum course. The delivery was at [name] gestational weeks.  Anesthesia: epidural. Postpartum course has been well. Baby is doing well. Baby is feeding by bottle - Similac Advance. Bleeding  slight spotting . Bowel function is normal. Bladder function is normal. Patient is sexually active. Contraception method is tubal ligation. Postpartum depression screening: negative.   The pregnancy intention screening data noted above was reviewed. Potential methods of contraception were discussed. The patient elected to proceed with No data recorded.    Health Maintenance Due  Topic Date Due   HPV VACCINES (2 - 3-dose series) 05/14/2010   COVID-19 Vaccine (3 - Pfizer risk series) 01/01/2020    The following portions of the patient's history were reviewed and updated as appropriate: allergies, current medications, past family history, past medical history, past social history, past surgical history, and problem list.  Review of Systems Integument/breast: positive for calcium deposits in skin where calcium IV infiltrated on  right arm  Objective:  LMP 10/04/2022    General:  alert, cooperative, and no distress   Breasts:  not indicated  Lungs:   Heart:  regular rate and rhythm  Abdomen: soft, non-tender; bowel sounds normal; no masses,  no organomegaly   Wound well approximated incision  GU exam:  not indicated       Assessment:    There are no diagnoses linked to this encounter.  normal postpartum exam.   Plan:   Essential components of care per ACOG recommendations:  1.  Mood and well being: Patient with negative depression screening today. Reviewed local resources for support.  - Patient tobacco use? No.   - hx of drug use? No.    2. Infant care and feeding:   -Patient currently breastmilk feeding? Yes. Discussed returning to work and pumping.  -Social determinants of health (SDOH) reviewed in EPIC. No concerns  3. Sexuality, contraception and birth spacing - Patient does not want a pregnancy in the next year.  Desired family size is 1 children.  - Reviewed reproductive life planning. Reviewed contraceptive methods based on pt preferences and effectiveness.  Patient desired Female Sterilization today.   - Discussed birth spacing of 18 months  4. Sleep and fatigue -Encouraged family/partner/community support of 4 hrs of uninterrupted sleep to help with mood and fatigue  5. Physical Recovery  - Discussed patients delivery and complications. She describes her labor as bad. - Patient had a C-section failure to progress. Patient had a  laceration. Perineal healing reviewed. Patient expressed understanding - Patient has urinary incontinence? No. - Patient is safe to resume physical and sexual activity  6.  Health Maintenance - HM due items addressed Yes - Last pap smear No results found for: "DIAGPAP" Pap smear not done at today's visit. Normal 12/2021 -Breast Cancer screening indicated? Yes. Patient referred today for mammogram.   7. Chronic Disease/Pregnancy Condition follow up: None  - PCP follow up  Adam Phenix, MD  [facility]

## 2023-09-11 ENCOUNTER — Other Ambulatory Visit: Payer: Self-pay | Admitting: Obstetrics and Gynecology

## 2023-09-11 DIAGNOSIS — B009 Herpesviral infection, unspecified: Secondary | ICD-10-CM

## 2023-09-15 ENCOUNTER — Encounter: Payer: Self-pay | Admitting: Obstetrics and Gynecology

## 2023-09-16 ENCOUNTER — Other Ambulatory Visit: Payer: Self-pay | Admitting: Lactation Services

## 2023-09-16 MED ORDER — VALACYCLOVIR HCL 500 MG PO TABS
500.0000 mg | ORAL_TABLET | Freq: Two times a day (BID) | ORAL | 99 refills | Status: AC
Start: 2023-09-16 — End: 2023-09-19

## 2023-09-16 NOTE — Telephone Encounter (Signed)
Ordered Valtrex at patient's request. PRN dosage sent per standing order.

## 2023-09-28 ENCOUNTER — Encounter: Payer: Self-pay | Admitting: Obstetrics and Gynecology
